# Patient Record
Sex: Male | Born: 1950 | Race: White | Hispanic: No | Marital: Married | Smoking: Former smoker
Health system: Southern US, Community
[De-identification: ages and names within clinical notes are randomized; demographics above are authoritative.]

## PROBLEM LIST (undated history)

## (undated) DIAGNOSIS — F988 Other specified behavioral and emotional disorders with onset usually occurring in childhood and adolescence: Secondary | ICD-10-CM

## (undated) DIAGNOSIS — M7989 Other specified soft tissue disorders: Secondary | ICD-10-CM

## (undated) DIAGNOSIS — E7800 Pure hypercholesterolemia, unspecified: Secondary | ICD-10-CM

## (undated) DIAGNOSIS — M2550 Pain in unspecified joint: Secondary | ICD-10-CM

## (undated) DIAGNOSIS — E119 Type 2 diabetes mellitus without complications: Secondary | ICD-10-CM

## (undated) DIAGNOSIS — R0602 Shortness of breath: Secondary | ICD-10-CM

## (undated) DIAGNOSIS — M25562 Pain in left knee: Secondary | ICD-10-CM

## (undated) DIAGNOSIS — G4730 Sleep apnea, unspecified: Secondary | ICD-10-CM

## (undated) DIAGNOSIS — K769 Liver disease, unspecified: Secondary | ICD-10-CM

## (undated) DIAGNOSIS — K754 Autoimmune hepatitis: Secondary | ICD-10-CM

## (undated) DIAGNOSIS — K760 Fatty (change of) liver, not elsewhere classified: Secondary | ICD-10-CM

## (undated) DIAGNOSIS — C801 Malignant (primary) neoplasm, unspecified: Secondary | ICD-10-CM

## (undated) DIAGNOSIS — I10 Essential (primary) hypertension: Secondary | ICD-10-CM

## (undated) DIAGNOSIS — M1990 Unspecified osteoarthritis, unspecified site: Secondary | ICD-10-CM

## (undated) DIAGNOSIS — E663 Overweight: Secondary | ICD-10-CM

## (undated) HISTORY — DX: Sleep apnea, unspecified: G47.30

## (undated) HISTORY — DX: Overweight: E66.3

## (undated) HISTORY — DX: Pure hypercholesterolemia, unspecified: E78.00

## (undated) HISTORY — DX: Liver disease, unspecified: K76.9

## (undated) HISTORY — DX: Pain in unspecified joint: M25.50

## (undated) HISTORY — PX: NECK SURGERY: SHX720

## (undated) HISTORY — DX: Type 2 diabetes mellitus without complications: E11.9

## (undated) HISTORY — PX: FACIAL FRACTURE SURGERY: SHX1570

## (undated) HISTORY — DX: Shortness of breath: R06.02

## (undated) HISTORY — DX: Other specified soft tissue disorders: M79.89

## (undated) HISTORY — DX: Fatty (change of) liver, not elsewhere classified: K76.0

## (undated) HISTORY — DX: Other specified behavioral and emotional disorders with onset usually occurring in childhood and adolescence: F98.8

## (undated) HISTORY — DX: Pain in left knee: M25.562

---

## 1971-08-19 HISTORY — PX: FACIAL FRACTURE SURGERY: SHX1570

## 1994-08-18 HISTORY — PX: LIVER BIOPSY: SHX301

## 2001-02-22 ENCOUNTER — Ambulatory Visit (HOSPITAL_COMMUNITY): Admission: RE | Admit: 2001-02-22 | Discharge: 2001-02-22 | Payer: Self-pay | Admitting: Gastroenterology

## 2001-02-22 ENCOUNTER — Encounter (INDEPENDENT_AMBULATORY_CARE_PROVIDER_SITE_OTHER): Payer: Self-pay | Admitting: Specialist

## 2003-08-19 HISTORY — PX: KNEE ARTHROSCOPY: SHX127

## 2003-12-19 ENCOUNTER — Ambulatory Visit (HOSPITAL_BASED_OUTPATIENT_CLINIC_OR_DEPARTMENT_OTHER): Admission: RE | Admit: 2003-12-19 | Discharge: 2003-12-19 | Payer: Self-pay | Admitting: Internal Medicine

## 2007-04-21 ENCOUNTER — Ambulatory Visit (HOSPITAL_COMMUNITY): Admission: RE | Admit: 2007-04-21 | Discharge: 2007-04-22 | Payer: Self-pay | Admitting: Neurosurgery

## 2007-10-24 ENCOUNTER — Observation Stay (HOSPITAL_COMMUNITY): Admission: EM | Admit: 2007-10-24 | Discharge: 2007-10-26 | Payer: Self-pay | Admitting: Emergency Medicine

## 2007-11-02 ENCOUNTER — Ambulatory Visit (HOSPITAL_COMMUNITY): Admission: RE | Admit: 2007-11-02 | Discharge: 2007-11-02 | Payer: Self-pay | Admitting: Gastroenterology

## 2008-01-21 ENCOUNTER — Encounter: Payer: Self-pay | Admitting: Internal Medicine

## 2009-05-05 ENCOUNTER — Inpatient Hospital Stay (HOSPITAL_COMMUNITY): Admission: EM | Admit: 2009-05-05 | Discharge: 2009-05-07 | Payer: Self-pay | Admitting: Emergency Medicine

## 2010-06-28 ENCOUNTER — Ambulatory Visit (HOSPITAL_COMMUNITY): Admission: RE | Admit: 2010-06-28 | Discharge: 2010-06-28 | Payer: Self-pay | Admitting: Gastroenterology

## 2010-09-19 NOTE — Letter (Signed)
Summary: [facility]  [facility]   Imported By: Stephannie Li 02/02/2008 11:37:13  _____________________________________________________________________  External Attachment:    Type:   Image     Comment:   External Document

## 2010-10-29 LAB — CBC
HCT: 44.4 % (ref 39.0–52.0)
Hemoglobin: 15.5 g/dL (ref 13.0–17.0)
MCH: 32 pg (ref 26.0–34.0)
MCHC: 35 g/dL (ref 30.0–36.0)
MCV: 91.4 fL (ref 78.0–100.0)
Platelets: 306 10*3/uL (ref 150–400)
RBC: 4.86 MIL/uL (ref 4.22–5.81)
RDW: 16.4 % — ABNORMAL HIGH (ref 11.5–15.5)
WBC: 9.2 10*3/uL (ref 4.0–10.5)

## 2010-10-29 LAB — APTT: aPTT: 28 seconds (ref 24–37)

## 2010-10-29 LAB — PROTIME-INR
INR: 0.98 (ref 0.00–1.49)
Prothrombin Time: 13.2 seconds (ref 11.6–15.2)

## 2010-11-22 LAB — COMPREHENSIVE METABOLIC PANEL
ALT: 46 U/L (ref 0–53)
ALT: 48 U/L (ref 0–53)
ALT: 51 U/L (ref 0–53)
AST: 29 U/L (ref 0–37)
AST: 31 U/L (ref 0–37)
AST: 34 U/L (ref 0–37)
Albumin: 3.2 g/dL — ABNORMAL LOW (ref 3.5–5.2)
Albumin: 3.4 g/dL — ABNORMAL LOW (ref 3.5–5.2)
Albumin: 4.1 g/dL (ref 3.5–5.2)
Alkaline Phosphatase: 47 U/L (ref 39–117)
Alkaline Phosphatase: 49 U/L (ref 39–117)
Alkaline Phosphatase: 58 U/L (ref 39–117)
BUN: 15 mg/dL (ref 6–23)
BUN: 17 mg/dL (ref 6–23)
BUN: 21 mg/dL (ref 6–23)
CO2: 27 mEq/L (ref 19–32)
CO2: 28 mEq/L (ref 19–32)
CO2: 30 mEq/L (ref 19–32)
Calcium: 8.8 mg/dL (ref 8.4–10.5)
Calcium: 8.8 mg/dL (ref 8.4–10.5)
Calcium: 9.5 mg/dL (ref 8.4–10.5)
Chloride: 94 mEq/L — ABNORMAL LOW (ref 96–112)
Chloride: 97 mEq/L (ref 96–112)
Chloride: 98 mEq/L (ref 96–112)
Creatinine, Ser: 1.12 mg/dL (ref 0.4–1.5)
Creatinine, Ser: 1.21 mg/dL (ref 0.4–1.5)
Creatinine, Ser: 1.37 mg/dL (ref 0.4–1.5)
GFR calc Af Amer: >60 mL/min (ref >60)
GFR calc Af Amer: >60 mL/min (ref >60)
GFR calc Af Amer: >60 mL/min (ref >60)
GFR calc non Af Amer: 53 mL/min — ABNORMAL LOW (ref >60)
GFR calc non Af Amer: >60 mL/min (ref >60)
GFR calc non Af Amer: >60 mL/min (ref >60)
Glucose, Bld: 119 mg/dL — ABNORMAL HIGH (ref 70–99)
Glucose, Bld: 126 mg/dL — ABNORMAL HIGH (ref 70–99)
Glucose, Bld: 155 mg/dL — ABNORMAL HIGH (ref 70–99)
Potassium: 3.5 mEq/L (ref 3.5–5.1)
Potassium: 3.6 mEq/L (ref 3.5–5.1)
Potassium: 3.7 mEq/L (ref 3.5–5.1)
Sodium: 131 mEq/L — ABNORMAL LOW (ref 135–145)
Sodium: 134 mEq/L — ABNORMAL LOW (ref 135–145)
Sodium: 136 mEq/L (ref 135–145)
Total Bilirubin: 1.3 mg/dL — ABNORMAL HIGH (ref 0.3–1.2)
Total Bilirubin: 1.3 mg/dL — ABNORMAL HIGH (ref 0.3–1.2)
Total Bilirubin: 1.4 mg/dL — ABNORMAL HIGH (ref 0.3–1.2)
Total Protein: 6.8 g/dL (ref 6.0–8.3)
Total Protein: 7.1 g/dL (ref 6.0–8.3)
Total Protein: 8.3 g/dL (ref 6.0–8.3)

## 2010-11-22 LAB — CBC
HCT: 43.9 % (ref 39.0–52.0)
HCT: 46.4 % (ref 39.0–52.0)
HCT: 51.6 % (ref 39.0–52.0)
Hemoglobin: 15.4 g/dL (ref 13.0–17.0)
Hemoglobin: 16.3 g/dL (ref 13.0–17.0)
Hemoglobin: 17.6 g/dL — ABNORMAL HIGH (ref 13.0–17.0)
MCHC: 34.2 g/dL (ref 30.0–36.0)
MCHC: 35.2 g/dL (ref 30.0–36.0)
MCHC: 35.2 g/dL (ref 30.0–36.0)
MCV: 92.7 fL (ref 78.0–100.0)
MCV: 92.9 fL (ref 78.0–100.0)
MCV: 93.4 fL (ref 78.0–100.0)
Platelets: 285 10*3/uL (ref 150–400)
Platelets: 286 10*3/uL (ref 150–400)
Platelets: 345 10*3/uL (ref 150–400)
RBC: 4.74 MIL/uL (ref 4.22–5.81)
RBC: 4.99 MIL/uL (ref 4.22–5.81)
RBC: 5.53 MIL/uL (ref 4.22–5.81)
RDW: 15.8 % — ABNORMAL HIGH (ref 11.5–15.5)
RDW: 16.2 % — ABNORMAL HIGH (ref 11.5–15.5)
RDW: 16.3 % — ABNORMAL HIGH (ref 11.5–15.5)
WBC: 10.7 10*3/uL — ABNORMAL HIGH (ref 4.0–10.5)
WBC: 5.7 10*3/uL (ref 4.0–10.5)
WBC: 5.7 10*3/uL (ref 4.0–10.5)

## 2010-11-22 LAB — URINALYSIS, ROUTINE W REFLEX MICROSCOPIC
Bilirubin Urine: NEGATIVE
Glucose, UA: NEGATIVE mg/dL
Ketones, ur: NEGATIVE mg/dL
Leukocytes, UA: NEGATIVE
Nitrite: NEGATIVE
Protein, ur: NEGATIVE mg/dL
Specific Gravity, Urine: >1.046 — ABNORMAL HIGH (ref 1.005–1.030)
Urobilinogen, UA: 0.2 mg/dL (ref 0.0–1.0)
pH: 6 (ref 5.0–8.0)

## 2010-11-22 LAB — CULTURE, BLOOD (ROUTINE X 2)
Culture: NO GROWTH
Culture: NO GROWTH

## 2010-11-22 LAB — URINE MICROSCOPIC-ADD ON

## 2010-11-22 LAB — DIFFERENTIAL
Basophils Absolute: 0 10*3/uL (ref 0.0–0.1)
Basophils Absolute: 0 10*3/uL (ref 0.0–0.1)
Basophils Absolute: 0 10*3/uL (ref 0.0–0.1)
Basophils Relative: 0 % (ref 0–1)
Basophils Relative: 0 % (ref 0–1)
Basophils Relative: 0 % (ref 0–1)
Eosinophils Absolute: 0 10*3/uL (ref 0.0–0.7)
Eosinophils Absolute: 0.1 10*3/uL (ref 0.0–0.7)
Eosinophils Absolute: 0.1 10*3/uL (ref 0.0–0.7)
Eosinophils Relative: 0 % (ref 0–5)
Eosinophils Relative: 2 % (ref 0–5)
Eosinophils Relative: 2 % (ref 0–5)
Lymphocytes Relative: 10 % — ABNORMAL LOW (ref 12–46)
Lymphocytes Relative: 12 % (ref 12–46)
Lymphocytes Relative: 3 % — ABNORMAL LOW (ref 12–46)
Lymphs Abs: 0.4 10*3/uL — ABNORMAL LOW (ref 0.7–4.0)
Lymphs Abs: 0.6 10*3/uL — ABNORMAL LOW (ref 0.7–4.0)
Lymphs Abs: 0.7 10*3/uL (ref 0.7–4.0)
Monocytes Absolute: 0.4 10*3/uL (ref 0.1–1.0)
Monocytes Absolute: 0.6 10*3/uL (ref 0.1–1.0)
Monocytes Absolute: 0.7 10*3/uL (ref 0.1–1.0)
Monocytes Relative: 11 % (ref 3–12)
Monocytes Relative: 12 % (ref 3–12)
Monocytes Relative: 4 % (ref 3–12)
Neutro Abs: 4.2 10*3/uL (ref 1.7–7.7)
Neutro Abs: 4.4 10*3/uL (ref 1.7–7.7)
Neutro Abs: 9.8 10*3/uL — ABNORMAL HIGH (ref 1.7–7.7)
Neutrophils Relative %: 75 % (ref 43–77)
Neutrophils Relative %: 77 % (ref 43–77)
Neutrophils Relative %: 92 % — ABNORMAL HIGH (ref 43–77)

## 2010-11-22 LAB — POCT I-STAT, CHEM 8
BUN: 19 mg/dL (ref 6–23)
Calcium, Ion: 1.13 mmol/L (ref 1.12–1.32)
Chloride: 99 mEq/L (ref 96–112)
Creatinine, Ser: 1 mg/dL (ref 0.4–1.5)
Glucose, Bld: 157 mg/dL — ABNORMAL HIGH (ref 70–99)
HCT: 55 % — ABNORMAL HIGH (ref 39.0–52.0)
Hemoglobin: 18.7 g/dL — ABNORMAL HIGH (ref 13.0–17.0)
Potassium: 3.7 mEq/L (ref 3.5–5.1)
Sodium: 136 mEq/L (ref 135–145)
TCO2: 29 mmol/L (ref 0–100)

## 2010-11-22 LAB — MAGNESIUM
Magnesium: 1.9 mg/dL (ref 1.5–2.5)
Magnesium: 2 mg/dL (ref 1.5–2.5)

## 2010-11-22 LAB — LIPASE, BLOOD: Lipase: 20 U/L (ref 11–59)

## 2010-12-31 NOTE — H&P (Signed)
NAME:  Bryan Wang, Bryan Wang             ACCOUNT NO.:  [id number]   MEDICAL RECORD NO.:  [id number]          PATIENT TYPE:  EMS   LOCATION:  ED                           FACILITY:  [facility]   PHYSICIAN:  [name], MD      DATE OF BIRTH:  Jan 17, 1951   DATE OF ADMISSION:  10/24/2007  DATE OF DISCHARGE:                              HISTORY & PHYSICAL   GASTROENTEROLOGIST:  Griffith Citron, M.D.   CHIEF COMPLAINT:  Coffee ground emesis, abdominal distention, and  diarrhea.   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  According to the patient he is feeling sick for a couple of days.  Early  morning, he started to have a huge amount of coffee ground emesis and  also abdominal distention and epigastric abdominal pain.  The patient  denied any history of endoscopy in the past and denies any similar  history in the past.  The patient denies any convulsions, denies any  change in mental status and denies any constipation.  Last bowel  movement was today watery, about two black stools.  The patient also  admitted he has watery stool yesterday about three times and is watery.  Condition associated with nausea.  The patient denies any fevers, denies  any shortness of breath or chest pain, denies any palpitations.   PAST MEDICAL HISTORY:  1. History of autoimmune hepatitis versus idiopathic hepatitis.  2. History of disk prolapse of the cervical region.   PAST SURGICAL HISTORY:  Status post liver biopsy.  The patient followed  by Dr. Kinnie Scales.   ALLERGIES:  No known drug allergies.   SOCIAL HISTORY:  The patient works as a Sports coach, here with his  wife he had daughter.  The patient denies any smoking or alcohol  history.  Denies any IV drug abuse.   FAMILY HISTORY:  Father alive had history of diabetes.  Mother is  healthy.   REVIEW OF SYSTEMS:  Complaint of dizziness.  Denies any headache.  Denies any numbness or weakness or extremity.  Denies any seizure  activity.  Denies any ecchymosis.  Denies any altered mental status.  Denies any chest pain.  Denies any burning micturition of hematuria.  Complained of dark stools.   PHYSICAL EXAMINATION:  VITAL SIGNS:  Blood pressure 116/70, pulse rate  105, respiratory rate 18, saturations 96%, and temperature 98.6.  GENERAL:  Obese male not in respiratory distress or shortness of breath.  HEENT:  Normocephalic and atraumatic.  Pupils equal, round, and reactive  to light and accommodation.  No jaundice, no cyanosis.  Mucosa moist.  No evidence of ecchymosis.  NECK:  Short.  No JVD and no lymphadenopathy.  HEART:  S1 and S2 tachy, no other murmur.  LUNGS:  Normal regular breathing, equal air entry.  ABDOMEN:  Distended.  Mild epigastric tenderness.  No rebound or  guarding.  No  organomegaly can be palpated.  Bowel sounds are audible,  but kind of diminished.  EXTREMITIES:  No lower extremity edema.  RECTAL:  Not being done.  SKIN:  No ecchymosis and no evidence of cyanosis.   LABORATORY DATA:  PT/INR 1.2.  Urinalysis; white blood cells 3.6, and  moderate bilirubin, negative for blood, lipase 23.  CMP showed sodium  137, potassium 3.8, chloride 99, CO2 27, glucose 137, BUN 121,  creatinine 1.4.  CBC; white blood cell count 5.9, hemoglobin 17.1,  hematocrit 47.9, platelets 357.   ASSESSMENT:  This is a 60 year old male with a history of autoimmune  hepatitis/idiopathic hepatitis, admitted to the hospital with coffee  ground emesis and abdominal distention most probably secondary to upper  gastrointestinal bleeding, cannot rule out gastritis versus gastric  ulcers versus esophageal varices .  The patient was admitted to  telemetry floor.  We will keep NPO.  We will place the patient on   Protonix IV 40 mg q.12 hours.  There is no evidence of chronic liver  disease in the sense of PT/INR or platelets.  The patient will be placed  octreotide IV 25 mcg per hour.  Gastroenterology consulted, Dr. Kinnie Scales.  He will see the patient today.  We will check CBC q.4 hours.  Abdominal  x-ray showed possibility of partial small bowel obstruction and with  evidence of abdominal distention on examination, we will go ahead and do  CT abdomen and pelvis with p.o. contrast for further evaluation.  We  will consider surgical consult if a necessity.  At this time we will  keep the patient NPO.  DVT and GI prophylaxis.      [name], MD  Electronically Signed     HIE/MEDQ  D:  10/24/2007  T:  10/24/2007  Job:  [id number]   cc:   Griffith Citron, M.D.  Fax: [phone]

## 2010-12-31 NOTE — Consult Note (Signed)
NAMEKIMANI, HOVIS             ACCOUNT NO.:  [id number]   MEDICAL RECORD NO.:  [id number]          PATIENT TYPE:  INP   LOCATION:  1403                         FACILITY:  [facility]   PHYSICIAN:  Griffith Citron, M.D.DATE OF BIRTH:  08-27-1950   DATE OF CONSULTATION:  10/24/2007  DATE OF DISCHARGE:                                 CONSULTATION   PATIENT PROFILE:  A 60 year old white male.   CHIEF COMPLAINT:  Abdominal pain.   HISTORY OF PRESENT ILLNESS:  The patient is a 60 year old white male who  is well known to me from several years of care for his autoimmune  hepatitis.  His present illness began five days ago when he had two  small volume semiformed stools.  [place] the [place]  business trip.  The following day, four days ago, he began to develop  mild epigastric soreness which gradually worsened on Friday and became  more severe yesterday.  This morning, he awakened with abdominal  distention feeling feverish with nausea.  He had three bouts of large  volume hematemesis each with coffee ground material.  No bright red  blood.  He did have some relief following emesis.  Seen at Urgent Care  and referred on to [facility] emergency room.  A low  grade fever of 99 was documented.  He was admitted with possible upper  GI bleed.  Abdominal series showed partial small bowel obstruction.  Labs were unrevealing except for mild transaminitis, mild pyuria with  normal hemoglobin/hematocrit.  Pro-time/INR is normal.   The patient was admitted and is presently comfortable after receiving  Phenergan, octreotide.  He is awaiting an abdominal CT with IV and oral  contrast.   The patient has not undergone any prior abdominal surgery.  He has  frequent indigestion for which he uses TUMS on a fairly regular basis.  He uses NSAIDS rarely.  No tobacco and no alcohol use.   The patient's autoimmune hepatitis has been treated for many years with  low dose  prednisone, presently on prednisone 7 mg daily and azathioprine  150 mg per day.  He has not been followed since C-spine laminectomy  September of 2008.  He is asymptomatic from his liver disease.   PAST MEDICAL HISTORY:  1. C-spine surgery in September 2008.  2. Obesity.   SOCIAL HISTORY:  The patient is married to wife, Elita Quick.  He is a Medical illustrator.  He has one daughter.  Her marriage is anticipated February 19, 2008.   PHYSICAL EXAMINATION:  GENERAL:  Comfortable, lying in bed watching  television, alert and oriented x3.  VITAL SIGNS:  Stable.  He is presently afebrile.  HEENT:  Anicteric sclerae, pink conjunctivae, no oropharyngeal lesion.  Mild tongue coating.  No exudate in the oropharynx.  Lips, gums, and  teeth are normal.  NECK:  Supple without adenopathy, thyromegaly or bruit.  CHEST:  Clear to auscultation.  There are no adventitious sounds.  HEART:  Regular rhythm.  No gallop and no murmur.  ABDOMEN:  Tightly distended.  There is no palpable organomegaly.  No  mass or firmness.  Bowel sounds are present, but scarce.  There is no  succussion splash.  The epigastrium is tender as is the right lower  quadrant.  There is no visible rebound, though, the patient does  complain to be tender on withdrawal.  RECTAL:  Not performed.  EXTREMITIES:  Well-perfused with normal capillary fill time without  edema or cyanosis.   LABORATORY DATA:  The patient has a mild ALT greater than AST elevation.  He has increased gamma globulins consistent with AIH.  The urine is  notable for ketones with 3-6 WBC per high powered field.   ASSESSMENT:  1. Partial small bowel obstruction.  The patient's symptoms, physical      examination, and abdominal x-rays are consistent with a partial      small bowel obstruction.  The cause is not obvious.  We must be      careful not to miss peritoneal inflammation with the patient on      prednisone, albeit low dose.  Cannot rule out appendicitis, ulcer       penetration.  I definitely agree with abdominal CT to further      delineate underlying pathology.  Also need to consider more      esoteric etiologies, EKG, Meckel's diverticulum, internal      herniation.  2. Transaminitis, secondary to autoimmune hepatitis.  The patient      needs increased steroid coverage.  This will be accomplished with      IV Solu-Medrol during his acute hospitalization.  There is no      evidence of liver disease contributing to his current illness.  3. Dyspepsia, hematemesis with coffee grounds certainly raises the      possibility of an acute gastritis, peptic disease possibly      exacerbated by recent NSAIDS use.  He is at increased risk on      prednisone.  IV Protonix has been initiated empirically and will be      continued.  Endoscopy will be considered, but the counter veiling      consideration is that of possible obstruction which is relative      contraindication to endoscopic evaluation.  This will be revisited      after CT information is reviewed.  4. Autoimmune hepatitis, longstanding, generally well controlled.      There is no indication of cirrhosis or hepatic decompensation.  The      patient is on azathioprine which can precipitate pancreatitis.      There is no evidence of pancreatitis with normal lipase, no      complaints of back pain.  CT scan will help to further evaluate any      pancreatic inflammation.   RECOMMENDATIONS:  1. Remain NPO, support with IV fluids.  2. Agree with IV Protonix.  3. Agree with abdominal CT.  4. Endoscopy will be considered pending CT findings.  If obstruction      is confirmed then endoscopy is contraindicated.  5. Surgical consultation will be obtained if there is a high grade      obstruction.  6. IV Solu-Medrol to cover steroid requirements while NPO in the      hospital.  7. Consider NG decompression if nausea and vomiting recur.  In the      interim, treat symptomatically with antiemetics.  Follow  up      evaluation will take place in the early morning following abdominal      CT.  Griffith Citron, M.D.  Electronically Signed     JRM/MEDQ  D:  10/24/2007  T:  10/25/2007  Job:  [id number]   cc:   [name], MD

## 2010-12-31 NOTE — Op Note (Signed)
NAME:  Bryan Wang, Bryan Wang NO.:  [id number]   MEDICAL RECORD NO.:  [id number]          PATIENT TYPE:  OIB   LOCATION:  5151                         FACILITY:  [facility]   PHYSICIAN:  Cristi Loron, M.D.DATE OF BIRTH:  1951-07-26   DATE OF PROCEDURE:  04/21/2007  DATE OF DISCHARGE:                               OPERATIVE REPORT   BRIEF HISTORY:  The patient is a 60 year old white male who has suffered  from neck and arm pain.  He has failed medical management, was worked up  with a cervical MRI which demonstrated herniated disk at C5-6.  The  patient's symptoms were consistent with a C6 radiculopathy.  I discussed  the various treatment options with the patient including surgery.  The  patient has weighed the risks, benefits and alternatives to surgery and  decided to proceed with C5-6 anterior cervical diskectomy fusion  plating.   PREOPERATIVE DIAGNOSIS:  C5-6 herniated nucleus pulposus, spondylosis,  disk degeneration, cervical radiculopathy, cervicalgia.   POSTOPERATIVE DIAGNOSIS:  C5-6 herniated nucleus pulposus, spondylosis,  disk degeneration, cervical radiculopathy, cervicalgia.   PROCEDURE:  C5-6 extensive anterior cervical diskectomy/decompression;  C5-6 anterior interbody local morselized autograft arthrodesis;  insertion of C5-6 interbody prosthesis (Alphatec PEEK interbody  prosthesis); C5-6 anterior cervical plating with Codman slim lock  titanium plate and screws.   SURGEON:  Dr. Delma Officer.   ASSISTANT:  Dr. Barnett Abu.   ANESTHESIA:  General endotracheal.   ESTIMATED BLOOD LOSS:  50 mL.   SPECIMENS:  None.   DRAINS:  None.   COMPLICATIONS:  None.   DESCRIPTION OF PROCEDURE:  The patient was brought to the operating room  by anesthesia team.  General endotracheal anesthesia was induced.  The  patient remained supine position.  A roll was placed under his shoulder,  placing his neck in slight extension.  His anterior cervical region  was  then prepared with Betadine scrub and Betadine solution.  Sterile drapes  were applied.  I then injected area to be incised with Marcaine with  epinephrine solution and used a scalpel to make a transverse incision in  the patient's left anterior neck.  I used the Metzenbaum scissors to  divide the platysma muscle and to dissect medial to the  sternocleidomastoid muscle, jugular vein and carotid artery.  I  carefully dissected down towards the anterior cervical spine,  identifying the esophagus and retracting it medially.  We then used  Kitner swabs to clear the soft tissue from the anterior cervical spine  and then inserted a bent spinal needle into the upper exposed  intervertebral disk space.  We obtained intraoperative radiograph to  confirm our location.   We then used electrocautery to detach the medial border of the longus  colli muscle bilaterally from C5-6 intervertebral disk space.  Inserted  a Engineer, technical sales for exposure.  We then incised the C5-  6 intervertebral disk with a 15 blade scalpel.  It was quite  spondylotic.  We performed a partial intervertebral diskectomy with a  pituitary forceps.  We inserted distraction screws to C5 and C6,  distracted interspace and then we  used a high-speed drill to decorticate  the vertebral endplates at C5-6, drill away the remainder of C5-6  intervertebral disk to drill away some posterior spondylosis and then to  thin out the posterior longitudinal ligament.  We incised the ligament  with arachnoid knife and removed it with a Kerrison punch undercutting  the vertebral endplates at C5-6 decompressing the thecal sac.  We then  performed foraminotomies about the bilateral C6 nerve root completing  the decompression.  Of note, we did encounter the expected herniated  disk which was more or less bilateral compressing bilateral C6 nerve  root.   Having completed the decompression we now turned attention to   arthrodesis.  I should mention that we inserted distraction screws to C5  and 6 distracted interspace prior to the decompression.  We used a trial  spacers and determined to use a 5-mm medium Alphatec PEEK interbody  prosthesis.  We prefilled this prosthesis with a combination of local  autograft bone and Vitoss bone graft extender and inserted the  prosthesis into distracted interspace, completing the arthrodesis.  We  removed distraction screws and noted there was a snug fit of the  prosthesis in the interspace.   We now turned attention to the anterior spinal instrumentation.  We used  a high-speed drill remove some ventral spondylosis from the vertebral  endplates at C5-6 so the plate would lay down flat.  We then selected  appropriate length Codman slim lock anterior cervical plate and laid it  along the anterior aspect of the vertebral bodies at C5 and C6.  We then  used the drill to drill two 14 mm holes at C5 and C6.  We then secured  the plate to the vertebral bodies by placing two 14 mm self-tapping  screws at C5 two at C6.  We obtained intraoperative radiograph.  There  was limited visualization because of the patient's body habitus but the  prosthesis and plate looked in good position in vivo.  We therefore  secured the screws to the plate by locking each cam completing the  instrumentation.   We then obtained hemostasis using bipolar cautery.  We irrigated the  wound out with bacitracin solution, removed the Caspar retractor.  We  then inspected esophagus for damage and none was noted.  We then  reapproximated the patient's platysma muscle with interrupted 3-0 Vicryl  suture, subcutaneous tissue with interrupted 3-0 Vicryl suture and skin  with Steri-Strips and Benzoin.  The wound was then coated with  bacitracin solution.  Sterile dressing applied.  The drapes were  removed.  The patient was subsequently extubated by anesthesia team and  transported to the post anesthesia  care unit in stable condition.  All  sponge, instrument and needle counts were correct at the end the case.      Cristi Loron, M.D.  Electronically Signed     JDJ/MEDQ  D:  04/21/2007  T:  04/22/2007  Job:  [id number]

## 2010-12-31 NOTE — Discharge Summary (Signed)
[name], Bryan Wang             ACCOUNT NO.:  [id number]   MEDICAL RECORD NO.:  [id number]          PATIENT TYPE:  INP   LOCATION:  1403                         FACILITY:  [facility]   PHYSICIAN:  Hind I Elsaid, MD      DATE OF BIRTH:  31-Jul-1951   DATE OF ADMISSION:  10/24/2007  DATE OF DISCHARGE:  10/26/2007                               DISCHARGE SUMMARY   DISCHARGE DIAGNOSES:  1. Upper gastrointestinal bleeding.  2. Gastric ulcer.  3. Function gastric outlet obstruction.  4. Abdominal pain felt to be secondary to renal colic, resolved.  5. Autoimmune hepatitis.  6. Partial small bowel obstruction, resolved.  7. Cholelithiasis.  8. Right kidney stone.   MEDICATIONS:  1. Protonix 40 mg p.o. b.i.d.  2. Imuran 150 mg p.o. daily.  3. Prednisone 7.5 mg p.o. daily.   PROCEDURES:  1. EGD and colonoscopy show gastric ulcer, acute without hemorrhage.  2. Abdominal x-ray finding consistent with partial small bowel      obstruction.  3. CT abdomen and pelvis/resolved small bowel obstruction with free      fluid in the distal small bowel. Mesentery without other focal      inflammatory change, normal.  Oblique: Cholelithiasis in the      gallbladder neck, right nephrolithiasis. Left renal cyst.  4. Abdominal x-ray: Small bowel obstruction, resolved      radiographically.  5. Contrast films are normal: Colon and appendix.   HOSPITAL COURSE:  History reviewed and history done by Dr. Eda Paschal Hind  and summarized. This is a 60 year old male with history of autoimmune  hepatitis on chronic steroids admitted to the hospital because of  complaint of abdominal pain with nausea, vomiting. The patient  experienced coffee ground emesis. Abdominal x-ray showed  ulcerative  small bowel obstruction.  The patient was admitted for monitoring and  evaluation.   1. Coffee ground emesis:  The patient was started on IV fluids and CBC      q.6.h.  During hospitalization, the patient did not require  any      kind of blood transfusion. Hemoglobin remained stable around 13.4.      It was noticed patient did not have any signal of chronic liver      disease in essence of low platelets or high PT/INR.  The patient      was restarted on Protonix IV and Gastroenterology consulted done by      Dr.  Kinnie Scales.  Endoscopy was done on March 9th, result as above.      The patient was started on a clear liquid diet and advanced diet      was done.  The patient denies any abdominal pain, nausea, or      vomiting.   1. Ulcerative small bowel obstruction:  CT scan was done which did      show resolved small bowel obstruction.  The patient has good bowel      movement and passing gas.  No evidence of abdominal pain.   1. Abdominal pain:  Abdominal pain  thought to be secondary to  nephrolithiasis and renal colic.  Pain completely resolved on the      date of discharge.   1. Autoimmune hepatitis: Patient placed on IV Solu-Medrol and Imuran      was resumed.  During hospitalization, pain completely resolved.  No      evidence of nausea or vomiting.  We felt that the patient was      medically stable to be discharged on Protonix and follow with Dr.      Kinnie Scales as an outpatient.  If the patient experiences any kind of      pain, the patient was advised to follow with a urologist regarding      his kidney stone.      [name], MD  Electronically Signed     HIE/MEDQ  D:  10/26/2007  T:  10/26/2007  Job:  [id number]   cc:   Griffith Citron, M.D.  Fax: [phone]

## 2011-05-12 LAB — CBC
HCT: 38.1 — ABNORMAL LOW
HCT: 38.2 — ABNORMAL LOW
HCT: 38.4 — ABNORMAL LOW
HCT: 39.6
HCT: 40.4
HCT: 41.3
HCT: 41.9
HCT: 42.4
HCT: 45.2
HCT: 47.9
Hemoglobin: 13.7
Hemoglobin: 13.7
Hemoglobin: 13.8
Hemoglobin: 14.1
Hemoglobin: 14.3
Hemoglobin: 14.4
Hemoglobin: 14.5
Hemoglobin: 14.7
Hemoglobin: 15.8
Hemoglobin: 17.1 — ABNORMAL HIGH
MCHC: 34.2
MCHC: 34.7
MCHC: 35
MCHC: 35.1
MCHC: 35.6
MCHC: 35.6
MCHC: 35.7
MCHC: 35.8
MCHC: 35.9
MCHC: 36
MCV: 90.1
MCV: 90.1
MCV: 90.1
MCV: 90.6
MCV: 91
MCV: 91
MCV: 91.2
MCV: 91.6
MCV: 92.3
MCV: 92.8
Platelets: 274
Platelets: 290
Platelets: 299
Platelets: 305
Platelets: 314
Platelets: 314
Platelets: 320
Platelets: 328
Platelets: 338
Platelets: 357
RBC: 4.2 — ABNORMAL LOW
RBC: 4.23
RBC: 4.24
RBC: 4.35
RBC: 4.48
RBC: 4.51
RBC: 4.57
RBC: 4.6
RBC: 4.9
RBC: 5.31
RDW: 14.7
RDW: 14.9
RDW: 15.4
RDW: 15.5
RDW: 15.6 — ABNORMAL HIGH
RDW: 15.6 — ABNORMAL HIGH
RDW: 15.7 — ABNORMAL HIGH
RDW: 15.8 — ABNORMAL HIGH
RDW: 15.9 — ABNORMAL HIGH
RDW: 16 — ABNORMAL HIGH
WBC: 3.6 — ABNORMAL LOW
WBC: 3.7 — ABNORMAL LOW
WBC: 4.1
WBC: 4.5
WBC: 5.2
WBC: 5.8
WBC: 5.9
WBC: 6
WBC: 6.3
WBC: 6.3

## 2011-05-12 LAB — COMPREHENSIVE METABOLIC PANEL
ALT: 62 — ABNORMAL HIGH
ALT: 72 — ABNORMAL HIGH
ALT: 79 — ABNORMAL HIGH
AST: 38 — ABNORMAL HIGH
AST: 44 — ABNORMAL HIGH
AST: 48 — ABNORMAL HIGH
Albumin: 3 — ABNORMAL LOW
Albumin: 3.1 — ABNORMAL LOW
Albumin: 4
Alkaline Phosphatase: 38 — ABNORMAL LOW
Alkaline Phosphatase: 41
Alkaline Phosphatase: 50
BUN: 19
BUN: 20
BUN: 22
CO2: 25
CO2: 27
CO2: 27
Calcium: 10.1
Calcium: 8.5
Calcium: 8.5
Chloride: 103
Chloride: 105
Chloride: 99
Creatinine, Ser: 1.09
Creatinine, Ser: 1.26
Creatinine, Ser: 1.4
GFR calc Af Amer: >60
GFR calc Af Amer: >60
GFR calc Af Amer: >60
GFR calc non Af Amer: 52 — ABNORMAL LOW
GFR calc non Af Amer: 59 — ABNORMAL LOW
GFR calc non Af Amer: >60
Glucose, Bld: 117 — ABNORMAL HIGH
Glucose, Bld: 135 — ABNORMAL HIGH
Glucose, Bld: 137 — ABNORMAL HIGH
Potassium: 3.8
Potassium: 4
Potassium: 4.2
Sodium: 136
Sodium: 137
Sodium: 138
Total Bilirubin: 1.1
Total Bilirubin: 1.8 — ABNORMAL HIGH
Total Bilirubin: 2.1 — ABNORMAL HIGH
Total Protein: 6.5
Total Protein: 7
Total Protein: 8.7 — ABNORMAL HIGH

## 2011-05-12 LAB — DIFFERENTIAL
Basophils Absolute: 0
Basophils Relative: 1
Eosinophils Absolute: 0
Eosinophils Relative: 0
Lymphocytes Relative: 9 — ABNORMAL LOW
Lymphs Abs: 0.5 — ABNORMAL LOW
Monocytes Absolute: 0.8
Monocytes Relative: 14 — ABNORMAL HIGH
Neutro Abs: 4.5
Neutrophils Relative %: 77

## 2011-05-12 LAB — URINALYSIS, ROUTINE W REFLEX MICROSCOPIC
Glucose, UA: NEGATIVE
Hgb urine dipstick: NEGATIVE
Ketones, ur: 15 — AB
Nitrite: POSITIVE — AB
Protein, ur: 100 — AB
Specific Gravity, Urine: 1.035 — ABNORMAL HIGH
Urobilinogen, UA: 1
pH: 5.5

## 2011-05-12 LAB — PROTIME-INR
INR: 1.2
INR: 1.3
Prothrombin Time: 15.3 — ABNORMAL HIGH
Prothrombin Time: 16.2 — ABNORMAL HIGH

## 2011-05-12 LAB — APTT
aPTT: 28
aPTT: 31

## 2011-05-12 LAB — URINE MICROSCOPIC-ADD ON

## 2011-05-12 LAB — CLOTEST (H. PYLORI), BIOPSY: Helicobacter screen: NEGATIVE

## 2011-05-12 LAB — LIPASE, BLOOD: Lipase: 23

## 2011-05-30 LAB — CBC
HCT: 43
Hemoglobin: 15.2
MCHC: 35.4
MCV: 93.1
Platelets: 341
RBC: 4.62
RDW: 16.9 — ABNORMAL HIGH
WBC: 8

## 2011-05-30 LAB — BASIC METABOLIC PANEL
BUN: 22
CO2: 29
Calcium: 9.6
Chloride: 99
Creatinine, Ser: 1.14
GFR calc Af Amer: >60
GFR calc non Af Amer: >60
Glucose, Bld: 118 — ABNORMAL HIGH
Potassium: 4.6
Sodium: 137

## 2011-08-31 ENCOUNTER — Emergency Department (INDEPENDENT_AMBULATORY_CARE_PROVIDER_SITE_OTHER): Payer: BC Managed Care – PPO

## 2011-08-31 ENCOUNTER — Encounter (HOSPITAL_COMMUNITY): Payer: Self-pay

## 2011-08-31 ENCOUNTER — Emergency Department (HOSPITAL_COMMUNITY)
Admission: EM | Admit: 2011-08-31 | Discharge: 2011-08-31 | Disposition: A | Discharge status: Home / Self Care | Payer: BC Managed Care – PPO | Source: Home / Self Care | Attending: Emergency Medicine | Admitting: Emergency Medicine

## 2011-08-31 DIAGNOSIS — S51809A Unspecified open wound of unspecified forearm, initial encounter: Secondary | ICD-10-CM

## 2011-08-31 DIAGNOSIS — S51811A Laceration without foreign body of right forearm, initial encounter: Secondary | ICD-10-CM

## 2011-08-31 HISTORY — DX: Autoimmune hepatitis: K75.4

## 2011-08-31 MED ORDER — CEFTRIAXONE SODIUM 1 G IJ SOLR
1.0000 g | Freq: Once | INTRAMUSCULAR | Status: AC
  Administered 2011-08-31: 1 g via INTRAMUSCULAR

## 2011-08-31 MED ORDER — LIDOCAINE HCL (PF) 1 % IJ SOLN
INTRAMUSCULAR | Status: AC
  Filled 2011-08-31: qty 5

## 2011-08-31 MED ORDER — CEFTRIAXONE SODIUM 1 G IJ SOLR
INTRAMUSCULAR | Status: AC
  Filled 2011-08-31: qty 10

## 2011-08-31 MED ORDER — CEPHALEXIN 500 MG PO CAPS: 500.0000 mg | ORAL_CAPSULE | Freq: Three times a day (TID) | ORAL | Status: AC

## 2011-08-31 MED ORDER — HYDROCODONE-ACETAMINOPHEN 5-325 MG PO TABS: ORAL_TABLET | ORAL | Status: AC

## 2011-08-31 MED ORDER — TETANUS-DIPHTH-ACELL PERTUSSIS 5-2.5-18.5 LF-MCG/0.5 IM SUSP
INTRAMUSCULAR | Status: AC
  Filled 2011-08-31: qty 0.5

## 2011-08-31 MED ORDER — TETANUS-DIPHTH-ACELL PERTUSSIS 5-2.5-18.5 LF-MCG/0.5 IM SUSP
0.5000 mL | Freq: Once | INTRAMUSCULAR | Status: AC
  Administered 2011-08-31: 0.5 mL via INTRAMUSCULAR

## 2011-08-31 NOTE — ED Notes (Signed)
Pt states he fell off his tailgate of his truck and hit arm on boat trailor. Had large laceration to rt forearm

## 2011-08-31 NOTE — ED Provider Notes (Signed)
Chief Complaint  Patient presents with  . Laceration    laceraton to rt forearm    History of Present Illness:  The patient lacerated his right forearm on a trailer hitch this afternoon. He fell landing on a trailer hitch and the stain a very large flap laceration of the right forearm. He can't recall when his last tetanus was. He's able to move all his digits and denies any numbness or tingling.  Review of Systems:  Other than noted above, the patient denies any of the following symptoms: Systemic:  No fever or chills. Musculoskeletal:  No joint pain or decreased range of motion. Neuro:  No numbness, tingling, or weakness.  PMFSH:  Past medical history, family history, social history, meds, and allergies were reviewed.  Physical Exam:   Vital signs:  BP 146/86  Pulse 84  Temp(Src) 97.3 F (36.3 C) (Oral)  Resp 18  SpO2 98% Ext:  He has a large flap laceration on the right dorsal forearm. Each leg of the flap measures 10 cm. Flap was completely separated from the underlying muscular tissue. It's not bleeding a lot. It's not contaminated. There no visible foreign bodies. The wound was fairly jagged and contained a deal of devitalized tissue. The laceration does not extend into the muscle tissue.  All joints had a full ROM without pain.  Pulses were full.  Good capillary refill in all digits.  No edema. Neurological:  Alert and oriented.  No muscle weakness.  Sensation was intact to light touch.   Procedure: Verbal informed consent was obtained.  The patient was informed of the risks and benefits of the procedure and understands and accepts.  Identity of the patient was verified verbally and by wristband.   The laceration area described above was prepped with Betadine, irrigated copiously with saline,  and anesthetized with 20 mL of 2% Xylocaine without epinephrine.  The wound was then closed as follows:  The wound was debrided of all devitalized tissue and the edges were freshened up. A  corner stitch was placed at the vertex of the angle and the 2 legs were approximated loosely with 4-0 nylon sutures. Total 20 sutures were used.  There were no immediate complications, and the patient tolerated the procedure well. A sterile pressure dressing was applied and with bacitracin  ointment.  Medications given in [facility]:  He was given Tdap vaccine and ceftriaxone 1 g IM because of the risk of infection.  Dg Forearm Right  08/31/2011  *RADIOLOGY REPORT*  Clinical Data: Status post fall.  Laceration.  RIGHT FOREARM - 2 VIEW  Comparison: None.  Findings: A large soft tissue defect is seen along the dorsal, radial aspect of the forearm with soft tissue gas consistent with laceration.  No fracture, dislocation or foreign body.  IMPRESSION: Laceration without fracture or foreign body.  Original Report Authenticated By: Bernadene Bell. Maricela Curet, M.D.   Assessment:   Diagnoses that have been ruled out:  None  Diagnoses that are still under consideration:  None  Final diagnoses:  Laceration of right forearm    Plan:   1.  The following meds were prescribed:   New Prescriptions   CEPHALEXIN (KEFLEX) 500 MG CAPSULE    Take 1 capsule (500 mg total) by mouth 3 (three) times daily.   HYDROCODONE-ACETAMINOPHEN (NORCO) 5-325 MG PER TABLET    1 to 2 tabs every 4 to 6 hours as needed for pain.   2.  The patient was instructed in wound care and pain control,  and handouts were given. 3.  The patient was told to return in 2 days for wound recheck or sooner if any sign of infection. 4.   I think the stitches need to stay in for a total of 2 weeks since this was such a large and deep laceration. We'll need to check him subsequently every 2-3 days to watch for development of infection. He was placed on cephalexin. I'll see him again in 2 days.     Roque Lias, MD 08/31/11 [phone]

## 2011-09-02 ENCOUNTER — Encounter (HOSPITAL_COMMUNITY): Payer: Self-pay

## 2011-09-02 ENCOUNTER — Emergency Department (INDEPENDENT_AMBULATORY_CARE_PROVIDER_SITE_OTHER)
Admission: EM | Admit: 2011-09-02 | Discharge: 2011-09-02 | Disposition: A | Discharge status: Home / Self Care | Payer: BC Managed Care – PPO | Source: Home / Self Care | Attending: Emergency Medicine | Admitting: Emergency Medicine

## 2011-09-02 DIAGNOSIS — X58XXXA Exposure to other specified factors, initial encounter: Secondary | ICD-10-CM

## 2011-09-02 DIAGNOSIS — IMO0002 Reserved for concepts with insufficient information to code with codable children: Secondary | ICD-10-CM

## 2011-09-02 DIAGNOSIS — T148XXA Other injury of unspecified body region, initial encounter: Secondary | ICD-10-CM

## 2011-09-02 NOTE — ED Provider Notes (Signed)
Chief Complaint  Patient presents with  . Laceration    History of Present Illness:  The patient returns for followup on his laceration on the right arm. Had to redress it once. It is a little bit sore but not severely painful. He denies any fever or chills. He's able to move all his digits well and he denies any numbness or weakness in the hand.  Review of Systems:  Other than noted above, the patient denies any of the following symptoms: Systemic:  No fevers, chills, sweats, or aches.  No fatigue or tiredness. Musculoskeletal:  No joint pain, arthritis, bursitis, swelling, back pain, or neck pain. Neurological:  No muscular weakness, paresthesias, headache, or trouble with speech or coordination.  No dizziness.   PMFSH:  Past medical history, family history, social history, meds, and allergies were reviewed.  Physical Exam:   Vital signs:  BP 132/89  Pulse 114  Temp(Src) 98.3 F (36.8 C) (Oral)  Resp 20  SpO2 96% Gen:  Alert and oriented times 3.  In no distress. Musculoskeletal: The dressing was removed and the wound is inspected and it appears to be healing well. There is no evidence of infection. No redness, swelling, or purulent drainage. Otherwise, all joints had a full a ROM with no swelling, bruising or deformity.  No edema, pulses full. Extremities were warm and pink.  Capillary refill was brisk.  Skin:  Clear, warm and dry.  No rash. Neuro:  Alert and oriented times 3.  Muscle strength was normal.  Sensation was intact to light touch.   08/31/2011  *RADIOLOGY REPORT*  Clinical Data: Status post fall.  Laceration.  RIGHT FOREARM - 2 VIEW  Comparison: None.  Findings: A large soft tissue defect is seen along the dorsal, radial aspect of the forearm with soft tissue gas consistent with laceration.  No fracture, dislocation or foreign body.  IMPRESSION: Laceration without fracture or foreign body.  Original Report Authenticated By: Bernadene Bell. Maricela Curet, M.D.    Medications given in  [facility]:  None  Assessment:   Diagnoses that have been ruled out:  None  Diagnoses that are still under consideration:  None  Final diagnoses:  Laceration    Plan:   1.  The following meds were prescribed:   New Prescriptions   No medications on file   2.  The patient was instructed in symptomatic care, including rest and activity, elevation, application of ice and compression.  Appropriate handouts were given. 3.  The patient was told to return if becoming worse in any way, if no better in 3 or 4 days, and given some red flag symptoms that would indicate earlier return.   4.  The patient was told to follow up in 3-4 days. He was instructed in wound care again. He may wash gently with soap and water, pat dry, apply antibiotic ointment, and a nonstick dressing followed by roll gauze. I would like to keep the stitches in for a total of 2 weeks. He was instructed to report immediately any evidence of infection.   Roque Lias, MD 09/02/11 [phone]

## 2011-09-02 NOTE — ED Notes (Signed)
Pt seen here 2 days ago for repair of laceration to rt forearm.  Here for recheck as directed.  Denies concerns

## 2011-09-14 ENCOUNTER — Emergency Department (INDEPENDENT_AMBULATORY_CARE_PROVIDER_SITE_OTHER)
Admission: EM | Admit: 2011-09-14 | Discharge: 2011-09-14 | Disposition: A | Discharge status: Home / Self Care | Payer: BC Managed Care – PPO | Source: Home / Self Care | Attending: Emergency Medicine | Admitting: Emergency Medicine

## 2011-09-14 ENCOUNTER — Encounter (HOSPITAL_COMMUNITY): Payer: Self-pay | Admitting: *Deleted

## 2011-09-14 DIAGNOSIS — T148XXA Other injury of unspecified body region, initial encounter: Secondary | ICD-10-CM

## 2011-09-14 DIAGNOSIS — IMO0002 Reserved for concepts with insufficient information to code with codable children: Secondary | ICD-10-CM

## 2011-09-14 DIAGNOSIS — X58XXXA Exposure to other specified factors, initial encounter: Secondary | ICD-10-CM

## 2011-09-14 NOTE — ED Provider Notes (Signed)
Chief Complaint  Patient presents with  . Suture / Staple Removal    History of Present Illness:  The patient returns today for suture removal. He had a large flap laceration on his right forearm that was sutured 2 weeks ago by me. It seems to be healing well so far. There's a little bit of inflammation around the sutures, but no evidence of infection. He denies any fever or chills.  Review of Systems:  Other than noted above, the patient denies any of the following symptoms: Systemic:  No fevers, chills, sweats, weight loss or gain, fatigue, or tiredness. Skin: No rash or itching. Extremities: No edema, numbness, tingling, or weakness.   PMFSH:  Past medical history, family history, social history, meds, and allergies were reviewed.  Physical Exam:   Vital signs:  BP 147/92  Pulse 85  Temp(Src) 97.5 F (36.4 C) (Oral)  Resp 16  SpO2 96% General:  Alert and oriented.  In no distress.  Skin warm and dry. Extremities: His wound is healing up well other than a little inflammation around the sutures. There is no evidence of dehiscence or infection. He has a full range of motion of all joints. Pulses are full. Good capillary refill.  Radiology:  Dg Forearm Right  08/31/2011  *RADIOLOGY REPORT*  Clinical Data: Status post fall.  Laceration.  RIGHT FOREARM - 2 VIEW  Comparison: None.  Findings: A large soft tissue defect is seen along the dorsal, radial aspect of the forearm with soft tissue gas consistent with laceration.  No fracture, dislocation or foreign body.  IMPRESSION: Laceration without fracture or foreign body.  Original Report Authenticated By: Bernadene Bell. Maricela Curet, M.D.    Medications given in [facility]:  None  Assessment:   Diagnoses that have been ruled out:  None  Diagnoses that are still under consideration:  None  Final diagnoses:  Laceration    Plan:   1. sutures were removed, Steri-Strips were applied, and the wound was redressed. He was instructed in wound care and  should return again as needed. 2.  The patient was instructed in symptomatic care and handouts were given.    Roque Lias, MD 09/14/11 [phone]

## 2011-09-14 NOTE — ED Notes (Signed)
Sutures placed to right forearm 2 wks ago here, had tetanus at that time, redness noted at suture line

## 2013-06-07 ENCOUNTER — Other Ambulatory Visit (HOSPITAL_COMMUNITY): Payer: Self-pay | Admitting: Family Medicine

## 2013-06-07 DIAGNOSIS — Z79899 Other long term (current) drug therapy: Secondary | ICD-10-CM

## 2013-06-14 ENCOUNTER — Ambulatory Visit (HOSPITAL_COMMUNITY)
Admission: RE | Admit: 2013-06-14 | Discharge: 2013-06-14 | Disposition: A | Discharge status: Home / Self Care | Payer: BC Managed Care – HMO | Source: Ambulatory Visit | Attending: Family Medicine | Admitting: Family Medicine

## 2013-06-14 DIAGNOSIS — Z13820 Encounter for screening for osteoporosis: Secondary | ICD-10-CM | POA: Insufficient documentation

## 2013-06-14 DIAGNOSIS — Z79899 Other long term (current) drug therapy: Secondary | ICD-10-CM

## 2014-05-10 ENCOUNTER — Other Ambulatory Visit: Payer: Self-pay | Admitting: Dermatology

## 2014-09-18 ENCOUNTER — Encounter: Payer: Self-pay | Admitting: Internal Medicine

## 2015-08-01 ENCOUNTER — Encounter: Payer: Self-pay | Admitting: Internal Medicine

## 2016-05-19 ENCOUNTER — Emergency Department (HOSPITAL_COMMUNITY)
Admission: EM | Admit: 2016-05-19 | Discharge: 2016-05-19 | Disposition: A | Discharge status: Home / Self Care | Payer: Medicare Other | Attending: Emergency Medicine | Admitting: Emergency Medicine

## 2016-05-19 ENCOUNTER — Encounter (HOSPITAL_COMMUNITY): Payer: Self-pay | Admitting: Emergency Medicine

## 2016-05-19 ENCOUNTER — Ambulatory Visit (HOSPITAL_COMMUNITY): Payer: Medicare Other

## 2016-05-19 DIAGNOSIS — G527 Disorders of multiple cranial nerves: Secondary | ICD-10-CM | POA: Diagnosis not present

## 2016-05-19 DIAGNOSIS — Z7984 Long term (current) use of oral hypoglycemic drugs: Secondary | ICD-10-CM | POA: Diagnosis not present

## 2016-05-19 DIAGNOSIS — Z79899 Other long term (current) drug therapy: Secondary | ICD-10-CM | POA: Insufficient documentation

## 2016-05-19 DIAGNOSIS — E119 Type 2 diabetes mellitus without complications: Secondary | ICD-10-CM | POA: Diagnosis not present

## 2016-05-19 DIAGNOSIS — H532 Diplopia: Secondary | ICD-10-CM

## 2016-05-19 DIAGNOSIS — H4921 Sixth [abducent] nerve palsy, right eye: Secondary | ICD-10-CM

## 2016-05-19 DIAGNOSIS — H538 Other visual disturbances: Secondary | ICD-10-CM | POA: Diagnosis present

## 2016-05-19 HISTORY — DX: Type 2 diabetes mellitus without complications: E11.9

## 2016-05-19 NOTE — ED Triage Notes (Signed)
Pt c/o bilateral blurred vision for about one week. Pt states he went to optamolog[facility] and stated he had allergies but unsure of why blurry vision. [facility] but [facility] not [facility]. Pt has Type 2 diabetes and states CBG this am was 164. Pt states this has been his usual lately.

## 2016-05-19 NOTE — ED Provider Notes (Signed)
[facility] DEPT Provider Note   CSN: [id number] Arrival date & time: 05/19/16  1427     History   Chief Complaint Chief Complaint  Patient presents with  . Blurred Vision    HPI Bryan Wang is a 65 y.o. male.Complains of double vision onset 1 week ago. He denies blurred vision. Symptoms not made better or worse by anything. No treatment prior to coming here. Patient reports that he was evaluated by an eye doctor earlier this week, who determined etiology of diplopia unclear. No focal numbness or weakness. No other associated symptoms. Nothing makes symptoms better or worse  HPI  Past Medical History:  Diagnosis Date  . Autoimmune hepatitis ([place])   . Diabetes mellitus without complication ([place])     There are no active problems to display for this patient.   Past Surgical History:  Procedure Laterality Date  . FACIAL FRACTURE SURGERY    . NECK SURGERY         Home Medications    Prior to Admission medications   Medication Sig Start Date End Date Taking? Authorizing Provider  azaTHIOprine (IMURAN) 50 MG tablet Take 200 mg by mouth daily.    Yes Historical Provider, MD  budesonide (ENTOCORT EC) 3 MG 24 hr capsule Take 9 mg by mouth daily.    Yes Historical Provider, MD  glipiZIDE (GLUCOTROL) 10 MG tablet Take 15 mg by mouth 2 (two) times daily.   Yes Historical Provider, MD  latanoprost (XALATAN) 0.005 % ophthalmic solution 1 drop nightly. Both eyes   Yes Historical Provider, MD  lisinopril (PRINIVIL,ZESTRIL) 2.5 MG tablet Take 2.5 mg by mouth daily.   Yes Historical Provider, MD  loratadine (CLARITIN) 10 MG tablet Take 10 mg by mouth daily.    Yes Historical Provider, MD  metFORMIN (GLUCOPHAGE) 1000 MG tablet Take 1,000 mg by mouth at bedtime.    Yes Historical Provider, MD  methylphenidate 27 MG PO CR tablet Take 27 mg by mouth daily.    Yes Historical Provider, MD  neomycin-polymyxin b-dexamethasone (MAXITROL) 3.5-10000-0.1 SUSP Place 1 drop into both eyes 4  (four) times daily. ABT Start Date 05/15/16 & End Date 05/22/16   Yes Historical Provider, MD    Family History No family history on file.  Social History Social History  Substance Use Topics  . Smoking status: Never Smoker  . Smokeless tobacco: Not on file  . Alcohol use No    No illicit drug use Allergies   Review of patient's allergies indicates no known allergies.   Review of Systems Review of Systems  Constitutional: Negative.   HENT: Negative.   Eyes: Positive for visual disturbance.  Respiratory: Negative.   Cardiovascular: Negative.   Gastrointestinal: Negative.   Musculoskeletal: Negative.   Skin: Negative.   Allergic/Immunologic: Positive for immunocompromised state.       Diabetic  Neurological: Negative.   Psychiatric/Behavioral: Negative.   All other systems reviewed and are negative.    Physical Exam Updated Vital Signs BP 151/75 (BP Location: Left Arm)   Pulse 93   Temp 97.9 F (36.6 C) (Oral)   Resp 18   Ht 5\' 7"  (1.702 m)   Wt 260 lb (117.9 kg)   SpO2 100%   BMI 40.72 kg/m   Physical Exam  Constitutional: He is oriented to person, place, and time. He appears well-developed and well-nourished.  HENT:  Head: Normocephalic and atraumatic.  Eyes: Conjunctivae are normal. Pupils are equal, round, and reactive to light.  Right-sided sixth cranial nerve  palsy  Neck: Neck supple. No tracheal deviation present. No thyromegaly present.  Cardiovascular: Normal rate and regular rhythm.   No murmur heard. Pulmonary/Chest: Effort normal and breath sounds normal.  Abdominal: Soft. Bowel sounds are normal. He exhibits no distension. There is no tenderness.  Musculoskeletal: Normal range of motion. He exhibits no edema or tenderness.  Neurological: He is alert and oriented to person, place, and time. Coordination normal.  Right-sided sixth cranial nerve palsy other cranial nerves II through XII grossly intact. Romberg normal pronator drift normal gait  normal DTRs symmetric bilaterally knee jerk ankle jerk and biceps toes downward going bilaterally  Skin: Skin is warm and dry. No rash noted.  Psychiatric: He has a normal mood and affect.  Nursing note and vitals reviewed.    ED Treatments / Results  Labs (all labs ordered are listed, but only abnormal results are displayed) Labs Reviewed - No data to display  EKG  EKG Interpretation None       Radiology No results found.  Procedures Procedures (including critical care time)  Medications Ordered in ED Medications - No data to display  Results for orders placed or performed during the hospital encounter of 06/28/10  CBC  Result Value Ref Range   WBC 9.2 4.0 - 10.5 K/uL   RBC 4.86 4.22 - 5.81 MIL/uL   Hemoglobin 15.5 13.0 - 17.0 g/dL   HCT 44.4 39.0 - 52.0 %   MCV 91.4 78.0 - 100.0 fL   MCH 32.0 26.0 - 34.0 pg   MCHC 35.0 30.0 - 36.0 g/dL   RDW 16.4 (H) 11.5 - 15.5 %   Platelets 306 150 - 400 K/uL  Protime-INR  Result Value Ref Range   Prothrombin Time 13.2 11.6 - 15.2 seconds   INR 0.98 0.00 - 1.49  APTT  Result Value Ref Range   aPTT 28 24 - 37 seconds   Mr Brain Wo Contrast  Result Date: 05/19/2016 CLINICAL DATA:  Blurry vision for 1 week. EXAM: MRI HEAD WITHOUT CONTRAST TECHNIQUE: Multiplanar, multiecho pulse sequences of the brain and surrounding structures were obtained without intravenous contrast. COMPARISON:  None. FINDINGS: Brain: No acute infarct or intraparenchymal hemorrhage. The midline structures are normal. There is bifrontal encephalomalacia in a pattern suggestive of prior traumatic injury. There is multifocal hyperintense T2-weighted signal within the periventricular and deep white matter, most often seen in the setting of chronic microvascular ischemia. No mass lesion or midline shift. No hydrocephalus or extra-axial fluid collection. Vascular: Major intracranial flow voids are preserved. No evidence of chronic microhemorrhage or amyloid  angiopathy. Skull and upper cervical spine: The visualized skull base, calvarium, upper cervical spine and extracranial soft tissues are normal. Sinuses/Orbits: No fluid levels or advanced mucosal thickening. No mastoid effusion. Normal orbits. Bilateral lens replacements. IMPRESSION: 1. No findings to explain the reported visual disturbances. 2. 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 3. Mild findings of chronic microvascular ischemia. Electronically Signed   By: Ulyses Jarred M.D.   On: 05/19/2016 18:50   Initial Impression / Assessment and Plan / ED Course  I have reviewed the triage vital signs and the nursing notes.  Pertinent labs & imaging results that were available during my care of the patient were reviewed by me and considered in my medical decision making (see chart for details).  Clinical Course   7:10 PM exam unchanged. Patient resting comfortably. Case discussed with Dr. Posey Pronto plan follow-up in office outpatient no further treatment needed  Final Clinical Impressions(s) / ED Diagnoses  Diagnosis right-sided sixth cranial nerve palsy Final diagnoses:  None    New Prescriptions New Prescriptions   No medications on file     Orlie Dakin, MD 05/19/16 [id number]

## 2016-05-19 NOTE — ED Notes (Signed)
Patient transported to MRI 

## 2016-05-21 ENCOUNTER — Telehealth: Payer: Self-pay | Admitting: *Deleted

## 2016-05-21 NOTE — Telephone Encounter (Signed)
Office called needing ED Provider noted faxed to their office.

## 2018-04-12 IMAGING — MR MR HEAD W/O CM
8 of 10 series · 38 of 48 positions shown · non-contrast
Comparison: None.

CLINICAL DATA: Blurry vision for 1 week.

EXAM:
MRI HEAD WITHOUT CONTRAST
TECHNIQUE: Multiplanar, multiecho pulse sequences of the brain and surrounding
structures were obtained without intravenous contrast.

[Series 3: T1 · sagittal · 5.0mm · 0.47mm/px · 1 of 27 slices shown]
[im 1/27]
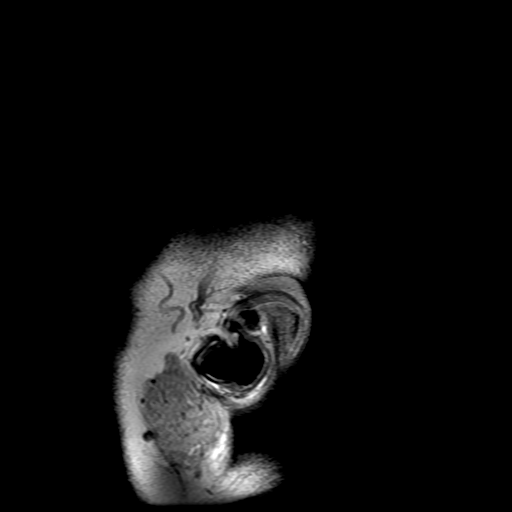

[Series 4: DWI · axial · 3.0mm · 1.09mm/px · z∈[-108,+56]mm · 11 of 112 slices shown (1 of 4)]
[im 1/112]
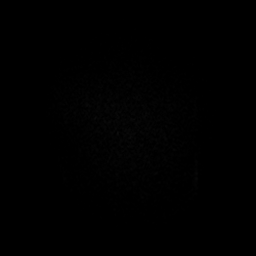
[im 12/112]
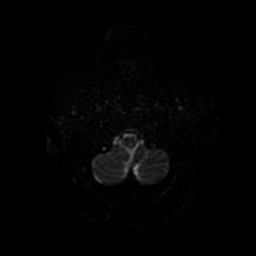
[im 23/112]
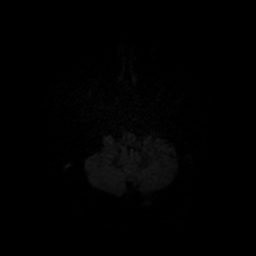
[im 34/112]
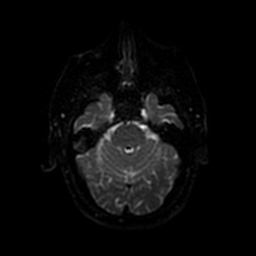
[im 45/112]
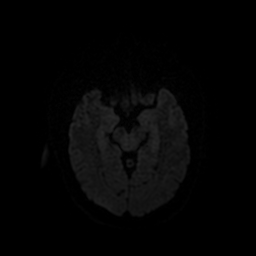
[im 56/112]
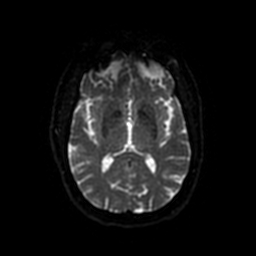
[im 67/112]
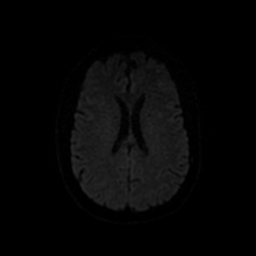
[im 78/112]
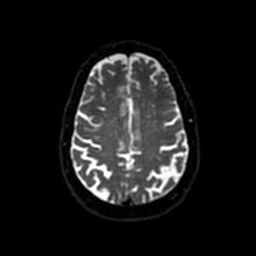
[im 89/112]
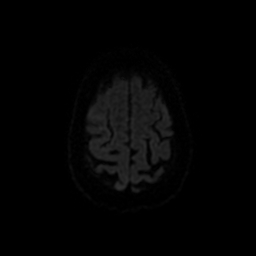
[im 100/112]
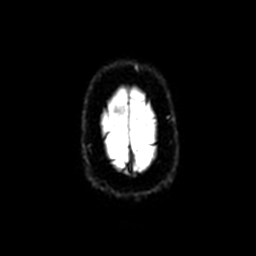
[im 112/112]
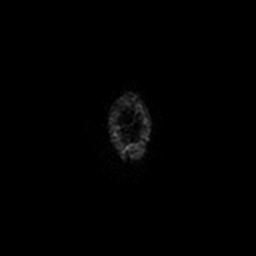

[Series 5: DWI · coronal · 5.0mm · 1.09mm/px · 8 of 82 slices shown (2 of 4)]
[im 1/82]
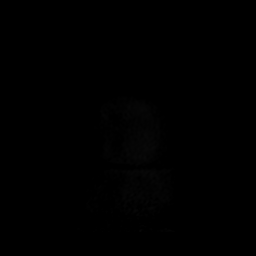
[im 12/82]
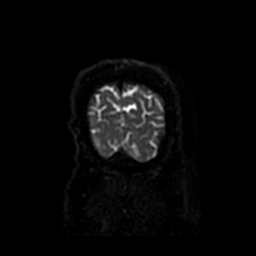
[im 24/82]
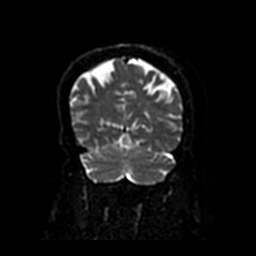
[im 35/82]
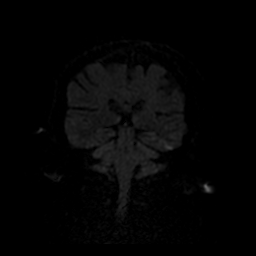
[im 47/82]
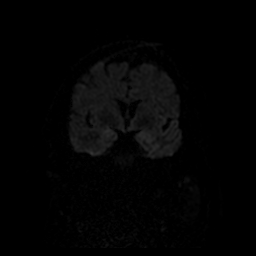
[im 58/82]
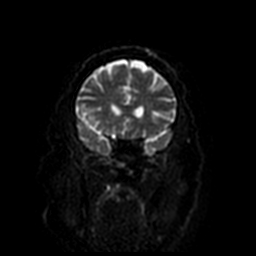
[im 70/82]
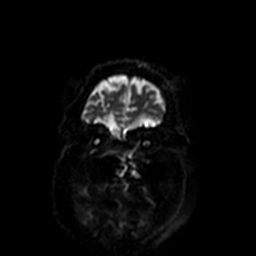
[im 82/82]
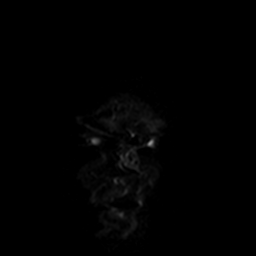

[Series 6: T2 · axial · 5.0mm · 0.43mm/px · z∈[-105,+63]mm · 3 of 27 slices shown]
[im 1/27]
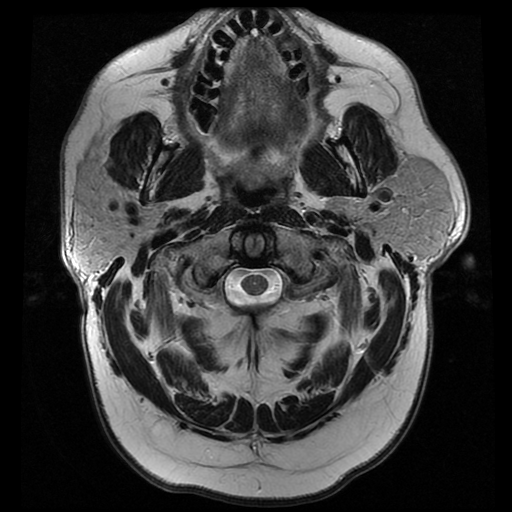
[im 14/27]
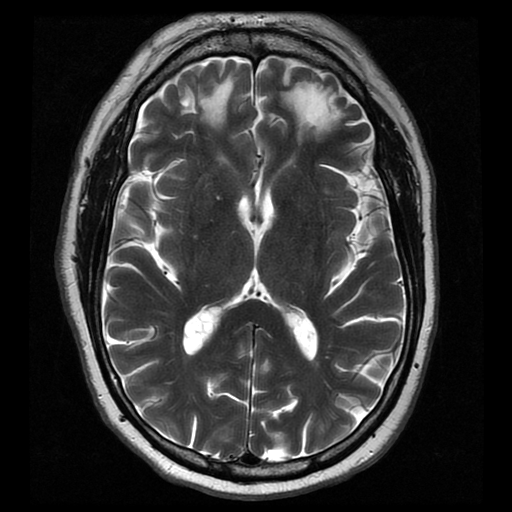
[im 27/27]
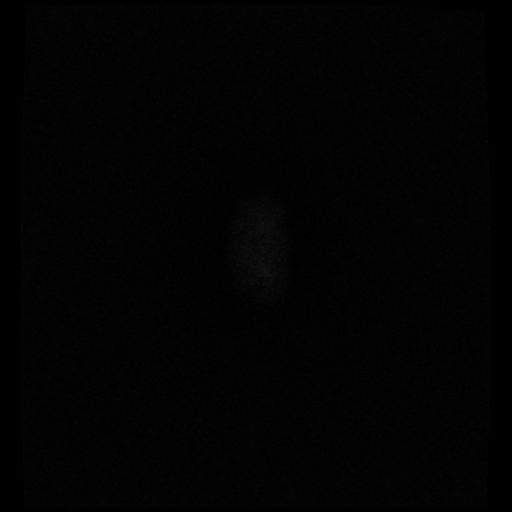

[Series 7: FLAIR · axial · 5.0mm · 0.43mm/px · z∈[-111,+69]mm · 3 of 27 slices shown]
[im 1/27]
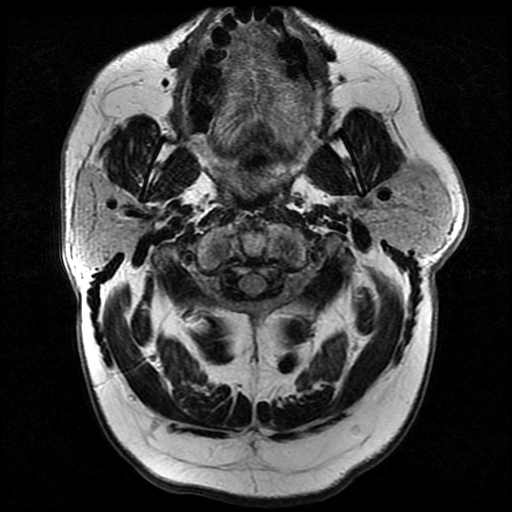
[im 14/27]
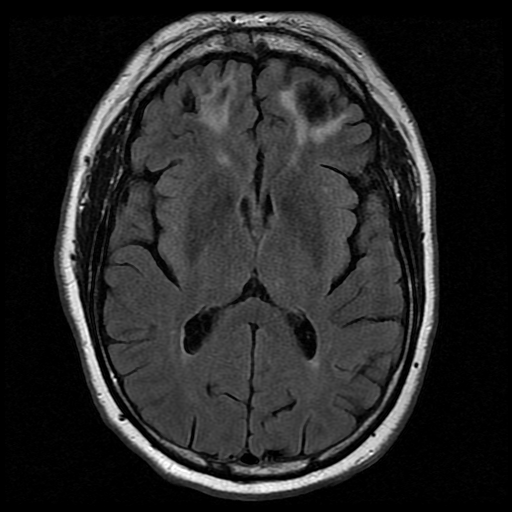
[im 27/27]
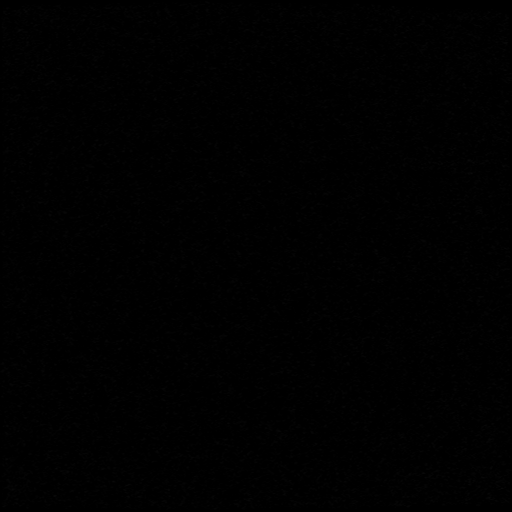

[Series 10: T2 post-contrast · coronal · 5.0mm · 0.45mm/px · 3 of 33 slices shown]
[im 1/33]
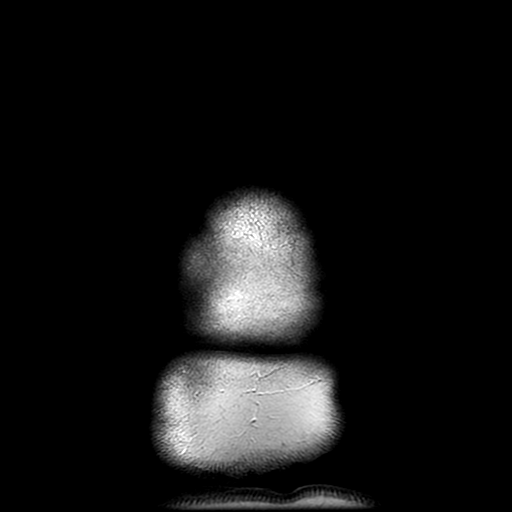
[im 17/33]
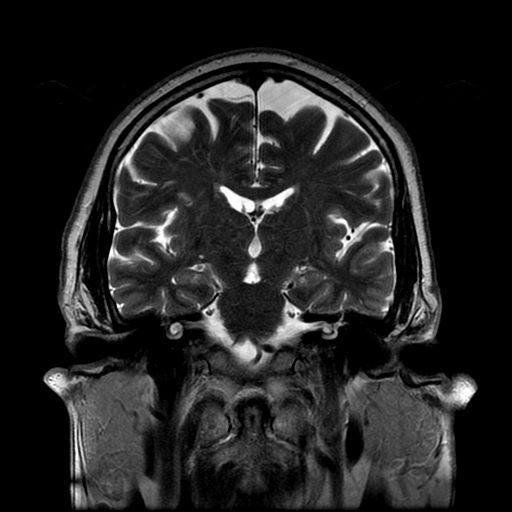
[im 33/33]
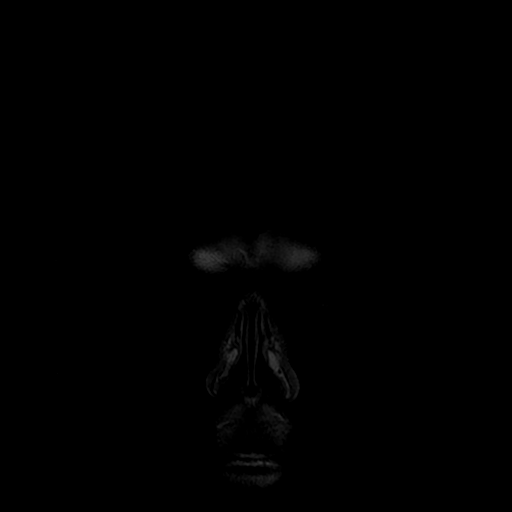

[Series 400: DWI · axial · 3.0mm · 1.09mm/px · z∈[-108,+56]mm · 5 of 56 slices shown (3 of 4)]
[im 1/56]
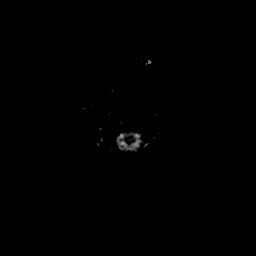
[im 14/56]
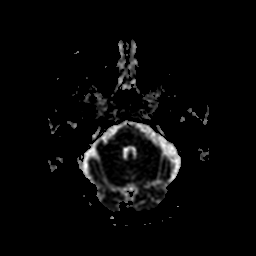
[im 28/56]
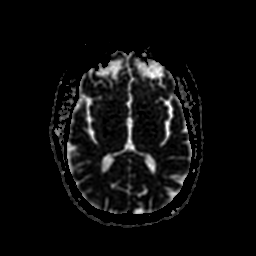
[im 42/56]
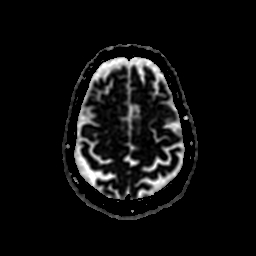
[im 56/56]
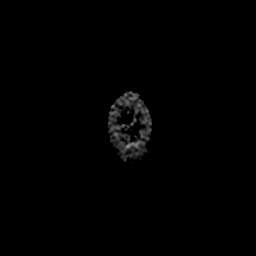

[Series 500: DWI · coronal · 5.0mm · 1.09mm/px · 4 of 41 slices shown (4 of 4)]
[im 1/41]
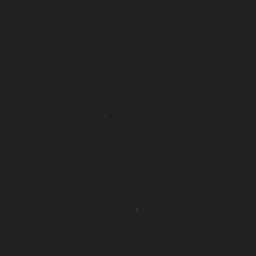
[im 14/41]
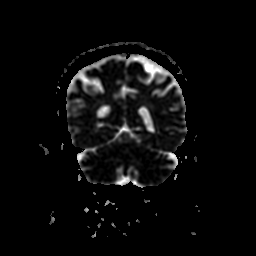
[im 27/41]
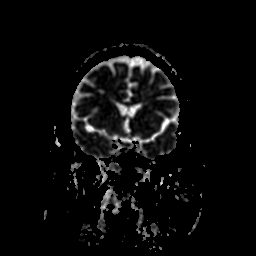
[im 41/41]
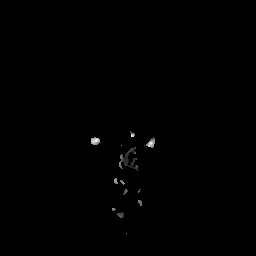

[38 of 48 positions shown; findings below may reference images not displayed]

FINDINGS: Brain: No acute infarct or intraparenchymal hemorrhage. The midline
structures are normal. There is bifrontal encephalomalacia in a
pattern suggestive of prior traumatic injury. There is multifocal
hyperintense T2-weighted signal within the periventricular and deep
white matter, most often seen in the setting of chronic
microvascular ischemia. No mass lesion or midline shift. No
hydrocephalus or extra-axial fluid collection.

Vascular: Major intracranial flow voids are preserved. No evidence
of chronic microhemorrhage or amyloid angiopathy.

Skull and upper cervical spine: The visualized skull base,
calvarium, upper cervical spine and extracranial soft tissues are
normal.

Sinuses/Orbits: No fluid levels or advanced mucosal thickening. No
mastoid effusion. Normal orbits. Bilateral lens replacements.
IMPRESSION: 1. No findings to explain the reported visual disturbances.
2. Bifrontal encephalomalacia in a pattern most suggestive of remote
traumatic injury.
3. Mild findings of chronic microvascular ischemia.

## 2019-11-19 ENCOUNTER — Encounter (HOSPITAL_BASED_OUTPATIENT_CLINIC_OR_DEPARTMENT_OTHER): Payer: Self-pay | Admitting: Emergency Medicine

## 2019-11-19 ENCOUNTER — Emergency Department (HOSPITAL_BASED_OUTPATIENT_CLINIC_OR_DEPARTMENT_OTHER)
Admission: EM | Admit: 2019-11-19 | Discharge: 2019-11-19 | Disposition: A | Discharge status: Home / Self Care | Payer: Medicare Other | Attending: Emergency Medicine | Admitting: Emergency Medicine

## 2019-11-19 ENCOUNTER — Other Ambulatory Visit: Payer: Self-pay

## 2019-11-19 DIAGNOSIS — E119 Type 2 diabetes mellitus without complications: Secondary | ICD-10-CM | POA: Diagnosis not present

## 2019-11-19 DIAGNOSIS — W228XXA Striking against or struck by other objects, initial encounter: Secondary | ICD-10-CM | POA: Diagnosis not present

## 2019-11-19 DIAGNOSIS — S8992XA Unspecified injury of left lower leg, initial encounter: Secondary | ICD-10-CM | POA: Diagnosis present

## 2019-11-19 DIAGNOSIS — Z23 Encounter for immunization: Secondary | ICD-10-CM | POA: Diagnosis not present

## 2019-11-19 DIAGNOSIS — Y92015 Private garage of single-family (private) house as the place of occurrence of the external cause: Secondary | ICD-10-CM | POA: Insufficient documentation

## 2019-11-19 DIAGNOSIS — Z7984 Long term (current) use of oral hypoglycemic drugs: Secondary | ICD-10-CM | POA: Insufficient documentation

## 2019-11-19 DIAGNOSIS — Y998 Other external cause status: Secondary | ICD-10-CM | POA: Insufficient documentation

## 2019-11-19 DIAGNOSIS — Y9389 Activity, other specified: Secondary | ICD-10-CM | POA: Insufficient documentation

## 2019-11-19 DIAGNOSIS — Z79899 Other long term (current) drug therapy: Secondary | ICD-10-CM | POA: Diagnosis not present

## 2019-11-19 DIAGNOSIS — S81812A Laceration without foreign body, left lower leg, initial encounter: Secondary | ICD-10-CM | POA: Insufficient documentation

## 2019-11-19 MED ORDER — HYDROCODONE-ACETAMINOPHEN 5-325 MG PO TABS
2.0000 | ORAL_TABLET | Freq: Once | ORAL | Status: AC
  Administered 2019-11-19: 2 via ORAL
  Filled 2019-11-19: qty 2

## 2019-11-19 MED ORDER — DOXYCYCLINE HYCLATE 100 MG PO TABS
100.0000 mg | ORAL_TABLET | Freq: Once | ORAL | Status: AC
  Administered 2019-11-19: 100 mg via ORAL
  Filled 2019-11-19: qty 1

## 2019-11-19 MED ORDER — DOXYCYCLINE HYCLATE 100 MG PO CAPS: 100.0000 mg | ORAL_CAPSULE | Freq: Two times a day (BID) | ORAL | 0 refills | Status: AC

## 2019-11-19 MED ORDER — LIDOCAINE HCL (PF) 1 % IJ SOLN
30.0000 mL | Freq: Once | INTRAMUSCULAR | Status: AC
  Administered 2019-11-19: 30 mL
  Filled 2019-11-19: qty 30

## 2019-11-19 MED ORDER — ONDANSETRON 4 MG PO TBDP
4.0000 mg | ORAL_TABLET | Freq: Once | ORAL | Status: AC
  Administered 2019-11-19: 4 mg via ORAL
  Filled 2019-11-19: qty 1

## 2019-11-19 MED ORDER — LIDOCAINE HCL (PF) 1 % IJ SOLN
INTRAMUSCULAR | Status: AC
  Filled 2019-11-19: qty 25

## 2019-11-19 MED ORDER — TETANUS-DIPHTH-ACELL PERTUSSIS 5-2.5-18.5 LF-MCG/0.5 IM SUSP
0.5000 mL | Freq: Once | INTRAMUSCULAR | Status: AC
  Administered 2019-11-19: 0.5 mL via INTRAMUSCULAR
  Filled 2019-11-19: qty 0.5

## 2019-11-19 NOTE — Discharge Instructions (Signed)
1. Medications: Tylenol or ibuprofen for pain, usual home medications  2. Treatment: ice for swelling, keep wound clean with warm soap and water and keep bandage dry, do not submerge in water for 24 hours  3. Follow Up: Please return in 10 days to have your stitches/staples removed or sooner if you have concerns. Return to the emergency department for increased redness, drainage of pus from the wound  -I have included information for plastic surgery to follow-up if needed.  As we discussed the wound on your leg is very large and will possibly need a skin graft.   WOUND CARE  Keep area clean and dry for 24 hours. Do not remove bandage, if applied.  After 24 hours, remove bandage and wash wound gently with mild soap and warm water. Reapply a new bandage after cleaning wound, if directed.   Continue daily cleansing with soap and water until stitches/staples are removed.  Do not apply any ointments or creams to the wound while stitches/staples are in place, as this may cause delayed healing. Return if you experience any of the following signs of infection: Swelling, redness, pus drainage, streaking, fever >101.0 F  Return if you experience excessive bleeding that does not stop after 15-20 minutes of constant, firm pressure.

## 2019-11-19 NOTE — ED Provider Notes (Signed)
[facility] EMERGENCY DEPARTMENT Provider Note   CSN: [id number] Arrival date & time: 11/19/19  1848     History Chief Complaint  Patient presents with  . Extremity Laceration    Bryan Wang is a 69 y.o. male with past medical history significant for diabetes, autoimmune hepatitis presents to emergency room today with chief complaint of left leg laceration.  This happened just prior to arrival.  It was in his garage moving a trailer when he accidentally dropped it and the trailer hitch slid down his left shin causing a laceration.  He had immediate pain and bleeding.  He describes the pain as an aching and throbbing sensation.  He rates the pain 7 out of 10 in severity.  He did not take anything for pain prior to arrival.  He has been able to walk and bear weight without any difficulty.  He denies fever, chills, numbness, tingling, decrease sensation, weakness of the left leg.  Tetanus is not up-to-date.  He is not anticoagulated.  History provided by patient with additional history obtained from chart review.        Past Medical History:  Diagnosis Date  . Autoimmune hepatitis ([place])   . Diabetes mellitus without complication ([place])     There are no problems to display for this patient.   Past Surgical History:  Procedure Laterality Date  . FACIAL FRACTURE SURGERY    . NECK SURGERY         History reviewed. No pertinent family history.  Social History   Tobacco Use  . Smoking status: Never Smoker  Substance Use Topics  . Alcohol use: No  . Drug use: No    Home Medications Prior to Admission medications   Medication Sig Start Date End Date Taking? Authorizing Provider  azaTHIOprine (IMURAN) 50 MG tablet Take 200 mg by mouth daily.     [provider]  budesonide (ENTOCORT EC) 3 MG 24 hr capsule Take 9 mg by mouth daily.     [provider]  doxycycline (VIBRAMYCIN) 100 MG capsule Take 1 capsule (100 mg total) by mouth 2 (two) times  daily for 7 days. 11/19/19 11/26/19  Cheyann Blecha E, PA-C  ferrous sulfate 325 (65 FE) MG tablet Take by mouth.    [provider]  glipiZIDE (GLUCOTROL) 10 MG tablet Take 15 mg by mouth 2 (two) times daily.    [provider]  latanoprost (XALATAN) 0.005 % ophthalmic solution 1 drop nightly. Both eyes    [provider]  lisinopril (PRINIVIL,ZESTRIL) 2.5 MG tablet Take 2.5 mg by mouth daily.    [provider]  loratadine (CLARITIN) 10 MG tablet Take 10 mg by mouth daily.     [provider]  metFORMIN (GLUCOPHAGE) 1000 MG tablet Take 1,000 mg by mouth at bedtime.     [provider]  methylphenidate 27 MG PO CR tablet Take 27 mg by mouth daily.     [provider]  neomycin-polymyxin b-dexamethasone (MAXITROL) 3.5-10000-0.1 SUSP Place 1 drop into both eyes 4 (four) times daily. ABT Start Date 05/15/16 & End Date 05/22/16    [provider]  rOPINIRole (REQUIP) 2 MG tablet Take by mouth.    [provider]    Allergies    Patient has no known allergies.  Review of Systems   Review of Systems All other systems are reviewed and are negative for acute change except as noted in the HPI.  Physical Exam Updated Vital Signs BP  121/65 (BP Location: Right Arm)   Pulse 82   Temp 98.4 F (36.9 C) (Oral)   Resp 20   Ht 5\' 7"  (1.702 m)   Wt 127 kg   SpO2 97%   BMI 43.85 kg/m   Physical Exam Vitals and nursing note reviewed.  Constitutional:      General: He is not in acute distress.    Appearance: He is not ill-appearing.  HENT:     Head: Normocephalic and atraumatic.     Right Ear: Tympanic membrane and external ear normal.     Left Ear: Tympanic membrane and external ear normal.     Nose: Nose normal.     Mouth/Throat:     Mouth: Mucous membranes are moist.     Pharynx: Oropharynx is clear.  Eyes:     General: No scleral icterus.       Right eye: No discharge.        Left eye: No discharge.      Extraocular Movements: Extraocular movements intact.     Conjunctiva/sclera: Conjunctivae normal.     Pupils: Pupils are equal, round, and reactive to light.  Neck:     Vascular: No JVD.  Cardiovascular:     Rate and Rhythm: Normal rate and regular rhythm.     Pulses: Normal pulses.          Radial pulses are 2+ on the right side and 2+ on the left side.     Heart sounds: Normal heart sounds.  Pulmonary:     Comments: Lungs clear to auscultation in all fields. Symmetric chest rise. No wheezing, rales, or rhonchi. Abdominal:     Comments: Abdomen is soft, non-distended, and non-tender in all quadrants. No rigidity, no guarding. No peritoneal signs.  Musculoskeletal:        General: Normal range of motion.     Cervical back: Normal range of motion.     Comments: Full range of motion of left knee and ankle.  Able to wiggle all toes.  DP pulse 2+ on the left.  Skin:    General: Skin is warm and dry.     Capillary Refill: Capillary refill takes less than 2 seconds.     Comments: Triangle shaped laceration to left anterior shin.  Please see media below.  Wound measures 9 x 12 x 9 cm.  No active bleeding.  Neurological:     Mental Status: He is oriented to person, place, and time.     GCS: GCS eye subscore is 4. GCS verbal subscore is 5. GCS motor subscore is 6.     Comments: Fluent speech, no facial droop.  Psychiatric:        Behavior: Behavior normal.       ED Results / Procedures / Treatments   Labs (all labs ordered are listed, but only abnormal results are displayed) Labs Reviewed - No data to display  EKG None  Radiology No results found.  Procedures .Marland KitchenLaceration Repair  Date/Time: 11/19/2019 11:29 PM Performed by: Cherre Robins, PA-C Authorized by: Cherre Robins, PA-C   Consent:    Consent obtained:  Verbal   Consent given by:  Patient   Risks discussed:  Infection, poor cosmetic result, poor wound healing and need for additional repair   Alternatives  discussed:  No treatment Anesthesia (see MAR for exact dosages):    Anesthesia method:  Local infiltration   Local anesthetic:  Lidocaine 1% w/o epi Laceration details:    Location:  Leg   Leg location:  L lower leg   Length (cm):  9   Depth (mm):  3 Repair type:    Repair type:  Simple Pre-procedure details:    Preparation:  Patient was prepped and draped in usual sterile fashion Exploration:    Hemostasis achieved with:  Direct pressure   Wound exploration: wound explored through full range of motion and entire depth of wound probed and visualized     Wound extent: no foreign bodies/material noted, no muscle damage noted and no tendon damage noted     Contaminated: no   Treatment:    Area cleansed with:  Saline   Amount of cleaning:  Extensive   Irrigation solution:  Sterile saline   Irrigation volume:  3000 ml   Irrigation method:  Pressure wash   Visualized foreign bodies/material removed: no   Skin repair:    Repair method:  Sutures   Suture size:  4-0   Suture technique:  Simple interrupted   Number of sutures:  8 Approximation:    Approximation:  Loose Post-procedure details:    Dressing:  Non-adherent dressing   Patient tolerance of procedure:  Tolerated well, no immediate complications   (including critical care time)  Medications Ordered in ED Medications  lidocaine (PF) (XYLOCAINE) 1 % injection (has no administration in time range)  HYDROcodone-acetaminophen (NORCO/VICODIN) 5-325 MG per tablet 2 tablet (2 tablets Oral Given 11/19/19 2031)  ondansetron (ZOFRAN-ODT) disintegrating tablet 4 mg (4 mg Oral Given 11/19/19 2032)  lidocaine (PF) (XYLOCAINE) 1 % injection 30 mL (30 mLs Infiltration Given by Other 11/19/19 2102)  Tdap (BOOSTRIX) injection 0.5 mL (0.5 mLs Intramuscular Given 11/19/19 2324)  doxycycline (VIBRA-TABS) tablet 100 mg (100 mg Oral Given 11/19/19 2324)    ED Course  I have reviewed the triage vital signs and the nursing notes.  Pertinent labs &  imaging results that were available during my care of the patient were reviewed by me and considered in my medical decision making (see chart for details).  Clinical Course as of Nov 19 2334  Sat Nov 19, 6134  2653 69 year old male diabetic here with a large V-shaped laceration over his anterior shin of his left leg.  Can be difficult to get full coverage with this as the tissue doesn't mobilize well.  We'll numb up irrigate out well and try some sutures to reapproximate as best as possible.  We'll try to get in with plastic surgery for close follow-up.   [MB]    Clinical Course User Index [MB] Hayden Rasmussen, MD   MDM Rules/Calculators/A&P                      Patient presents to the emergency department with laceration to left shin which occurred within 1 hours PTA. Patient nontoxic appearing, resting comfortably.  Copious pressure irrigation performed. Wound explored and base of wound visualized in a bloodless field without evidence of foreign body. Laceration repair per procedure note above, tolerated well.  Wound was difficult to approximate given the size and that tissue did not mobilize.Tetanus updated at today's visit.  Will cover with antibiotics based on wound not being able to be fully closed and he is diabetic. Discussed suture home care as well as need for wound recheck and suture removal in 10 days. Had lengthy discussion about future wound care and need for possible skin graft. He gets his medical care through the [facility] and plans to follow-up with them.  I did  include information for plastic surgery in case he needs that for follow-up as well.  I discussed results, treatment plan, need for follow-up, and return precautions with the patient including signs of infection. Provided opportunity for questions, patient confirmed understanding and is in agreement with plan. The patient was discussed with and seen by Dr. Melina Copa who agrees with the treatment plan.   Portions of this note were  generated with [employer]. Dictation errors may occur despite best attempts at proofreading.   Final Clinical Impression(s) / ED Diagnoses Final diagnoses:  Laceration of left lower extremity, initial encounter    Rx / DC Orders ED Discharge Orders         Ordered    doxycycline (VIBRAMYCIN) 100 MG capsule  2 times daily     11/19/19 2313           Kortne All, [place] Noe 11/19/19 2336    Hayden Rasmussen, MD 11/20/19 [phone]

## 2019-11-19 NOTE — ED Triage Notes (Signed)
Pt reports Left lower leg laceration 30 minuttes pta. Bleeding controlled.  Pt reports trailer hitch fell onto leg.

## 2020-04-03 ENCOUNTER — Other Ambulatory Visit (INDEPENDENT_AMBULATORY_CARE_PROVIDER_SITE_OTHER): Payer: Self-pay | Admitting: Family Medicine

## 2020-04-03 ENCOUNTER — Ambulatory Visit (INDEPENDENT_AMBULATORY_CARE_PROVIDER_SITE_OTHER): Payer: Medicare Other | Admitting: Family Medicine

## 2020-04-03 ENCOUNTER — Other Ambulatory Visit: Payer: Self-pay

## 2020-04-03 ENCOUNTER — Encounter (INDEPENDENT_AMBULATORY_CARE_PROVIDER_SITE_OTHER): Payer: Self-pay | Admitting: Family Medicine

## 2020-04-03 VITALS — BP 135/78 | HR 100 | Temp 98.2°F | Ht 68.0 in | Wt 299.0 lb

## 2020-04-03 DIAGNOSIS — Z0289 Encounter for other administrative examinations: Secondary | ICD-10-CM

## 2020-04-03 DIAGNOSIS — R030 Elevated blood-pressure reading, without diagnosis of hypertension: Secondary | ICD-10-CM | POA: Diagnosis not present

## 2020-04-03 DIAGNOSIS — E785 Hyperlipidemia, unspecified: Secondary | ICD-10-CM

## 2020-04-03 DIAGNOSIS — G4733 Obstructive sleep apnea (adult) (pediatric): Secondary | ICD-10-CM

## 2020-04-03 DIAGNOSIS — Z9989 Dependence on other enabling machines and devices: Secondary | ICD-10-CM

## 2020-04-03 DIAGNOSIS — R0602 Shortness of breath: Secondary | ICD-10-CM | POA: Diagnosis not present

## 2020-04-03 DIAGNOSIS — E1169 Type 2 diabetes mellitus with other specified complication: Secondary | ICD-10-CM | POA: Diagnosis not present

## 2020-04-03 DIAGNOSIS — R5383 Other fatigue: Secondary | ICD-10-CM

## 2020-04-03 DIAGNOSIS — Z1331 Encounter for screening for depression: Secondary | ICD-10-CM

## 2020-04-03 DIAGNOSIS — Z87891 Personal history of nicotine dependence: Secondary | ICD-10-CM

## 2020-04-03 DIAGNOSIS — Z6841 Body Mass Index (BMI) 40.0 and over, adult: Secondary | ICD-10-CM | POA: Diagnosis not present

## 2020-04-03 DIAGNOSIS — K760 Fatty (change of) liver, not elsewhere classified: Secondary | ICD-10-CM

## 2020-04-03 NOTE — Progress Notes (Signed)
Chief Complaint:   OBESITY Bryan Wang (MR# [id number]) is a 69 y.o. male who presents for evaluation and treatment of obesity and related comorbidities. Current BMI is Body mass index is 45.46 kg/m. Bryan Wang has been struggling with his weight for many years and has been unsuccessful in either losing weight, maintaining weight loss, or reaching his healthy weight goal.  Bryan Wang is currently in the action stage of change and ready to dedicate time achieving and maintaining a healthier weight. Bryan Wang is interested in becoming our patient and working on intensive lifestyle modifications including (but not limited to) diet and exercise for weight loss.  Bryan Wang lives with his wife, Bryan Wang.  He craves red meat and ice cream.  He says that he snacks at night, sometimes on peanut butter crackers.  He often skips breakfast (eats 2 meals a day on average).  He drinks coffee with sweeteners, drinks 2 beers per week, drinks green tea with non-sugar sweeteners, and milk.  He says he does the cooking and shopping.   His PCP is located at the [facility] in [place].  Bryan Wang's habits were reviewed today and are as follows: His family eats meals together, he thinks his family will eat healthier with him, his desired weight loss is 100+ pounds, he started gaining weight in 1996, his heaviest weight ever was 299 pounds, he is a picky eater and doesn't like to eat healthier foods, he red meat and ice cream, he snacks frequently in the evenings, he wakes up frequently in the middle of the night to eat, he skips breakfast frequently, he frequently makes poor food choices, he frequently eats larger portions than normal and he struggles with emotional eating.  Depression Screen Bryan Wang's Food and Mood (modified PHQ-9) score was 10.  Depression screen PHQ 2/9 04/03/2020  Decreased Interest 1  Down, Depressed, Hopeless 1  PHQ - 2 Score 2  Altered sleeping 2  Tired, decreased energy 2  Change in appetite 1  Feeling bad  or failure about yourself  1  Trouble concentrating 1  Moving slowly or fidgety/restless 1  Suicidal thoughts 0  PHQ-9 Score 10  Difficult doing work/chores Somewhat difficult   Subjective:   1. Other fatigue Bryan Wang denies daytime somnolence and admits to waking up still tired. Patent has a history of symptoms of morning fatigue and snoring. Bryan Wang generally gets 8 hours of sleep some nights, but only 3-4 hours of sleep other nights, and states that he has occasional restful sleep. Snoring is present. Apneic episodes are present. Epworth Sleepiness Score is 2.  He endorses weakness and fatigue that is chronic in nature.  ROS:  Denies chest pain, heart palpitations, orthopnea, or PND.  2. SOB (shortness of breath) on exertion Bryan Wang notes increasing shortness of breath with exercising and seems to be worsening over time with weight gain. He notes getting out of breath sooner with activity than he used to. This has gotten worse recently. Bryan Wang denies shortness of breath at rest or orthopnea.  He endorses weakness and fatigue that is chronic in nature.  ROS:  Denies chest pain, heart palpitations, orthopnea, or PND.  3. Type 2 diabetes mellitus with other specified complication, without long-term current use of insulin (HCC) Medications reviewed. Diabetic ROS: no polyuria or polydipsia, no chest pain, dyspnea or TIA's, no numbness, tingling or pain in extremities.  He was diagnosed 3 years ago and has been on an ACE inhibitor for renal protection.  He is also on Alogliptin, glipizide, and  metformin.  Management of medications is through his PCP at the [facility].  Denies issues with medications or concerns.  Unknown last A1c.  Last done around April 2021.    Lab Results  Component Value Date   HGBA1C 8.5 (H) 04/03/2020   Lab Results  Component Value Date   LDLCALC 115 (H) 04/03/2020   CREATININE 0.97 04/03/2020   4. Hyperlipidemia associated with type 2 diabetes mellitus ([place]) Bryan Wang has  hyperlipidemia and has been trying to improve his cholesterol levels with intensive lifestyle modification including a low saturated fat diet, exercise and weight loss. He denies any chest pain, claudication or myalgias.  He is taking Lipitor without any issues.  Lab Results  Component Value Date   ALT 41 04/03/2020   AST 30 04/03/2020   ALKPHOS 59 04/03/2020   BILITOT 0.4 04/03/2020   5. 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  He also has low blood pressure but has known white coat syndrome.  He was also placed on lisinopril to protect his kidneys due to diabetes.  BP Readings from Last 3 Encounters:  04/03/20 135/78  11/19/19 121/65  05/19/16 133/77   6. Nonalcoholic hepatosteatosis Bryan Wang is followed by Dr. Earlean Shawl of GI at [facility].  Lab Results  Component Value Date   ALT 41 04/03/2020   AST 30 04/03/2020   ALKPHOS 59 04/03/2020   BILITOT 0.4 04/03/2020   7. OSA on CPAP Bryan Wang has a diagnosis of sleep apnea. He reports that he is using a CPAP regularly.  Denies any issues.  8. History of tobacco abuse He has a 25 pack year smoking history.  He quit smoking 30 years ago.  9. Depression screening Bryan Wang was screened for depression as part of his new patient workup.  Assessment/Plan:   1. Other fatigue Bryan Wang does feel that his weight is causing his energy to be lower than it should be. Fatigue may be related to obesity, depression or many other causes. Labs will be ordered, and in the meanwhile, Bryan Wang will focus on self care including making healthy food choices, increasing physical activity and focusing on stress reduction. - EKG 12-Lead - CBC with Differential/Platelet - T3 - T4 - TSH  2. SOB (shortness of breath) on exertion Bryan Wang does feel that he gets out of breath more easily that he used to when he exercises. Bryan Wang's shortness of breath appears to be obesity related  and exercise induced. He has agreed to work on weight loss and gradually increase exercise to treat his exercise induced shortness of breath. Will continue to monitor closely. - CBC with Differential/Platelet  3. Type 2 diabetes mellitus with other specified complication, without long-term current use of insulin (HCC) Bryan Wang blood sugar control is important to decrease the likelihood of diabetic complications such as nephropathy, neuropathy, limb loss, blindness, coronary artery disease, and death. Intensive lifestyle modification including diet, exercise and weight loss are the first line of treatment for diabetes.  Check labs, prudent nutritional plan, weight loss.  Education was done regarding blood sugars, carbohydrates, etc. - Comprehensive metabolic panel - Hemoglobin A1c - Insulin, random  4. Hyperlipidemia associated with type 2 diabetes mellitus ([place]) Cardiovascular risk and specific lipid/LDL goals reviewed.  We discussed several lifestyle modifications today and Jarrin will continue to work on diet, exercise and weight loss efforts. Orders and follow up as documented in patient record.  Check labs, prudent nutritional plan, weight loss.  Counseling Intensive lifestyle modifications are the first line treatment for this issue. . Dietary changes: Increase soluble fiber. Decrease simple carbohydrates. . Exercise changes: Moderate to vigorous-intensity aerobic activity 150 minutes per week if tolerated. . Lipid-lowering medications: see documented in medical record. - Lipid panel  5. 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  6. Nonalcoholic hepatosteatosis Check labs today.  Continue with Dr. Earlean Shawl of GI.  I reviewed his notes and he was last seen over 1.5-2 years ago.  Denies labs for several years at [facility].  7. OSA on CPAP Intensive lifestyle modifications are the first line treatment for this issue.  We discussed several lifestyle modifications today and he will continue to work on diet, exercise and weight loss efforts. We will continue to monitor. Orders and follow up as documented in patient record.  Continue with CPAP.  Management per [facility] doctor.  Counseling  Sleep apnea is a condition in which breathing pauses or becomes shallow during sleep. This happens over and over during the night. This disrupts your sleep and keeps your body from getting the rest that it needs, which can cause tiredness and lack of energy (fatigue) during the day.  Sleep apnea treatment: If you were given a device to open your airway while you sleep, USE IT!  Sleep hygiene:   Limit or avoid alcohol, caffeinated beverages, and cigarettes, especially close to bedtime.   Do not eat a large meal or eat spicy foods right before bedtime. This can lead to digestive discomfort that can make it hard for you to sleep.  Keep a sleep diary to help you and your health care provider figure out what could be causing your insomnia.  . Make your bedroom a dark, comfortable place where it is easy to fall asleep. ? Put up shades or blackout curtains to block light from outside. ? Use a white noise machine to block noise. ? Keep the temperature cool. . Limit screen use before bedtime. This includes: ? Watching TV. ? Using your smartphone, tablet, or computer. . Stick to a routine that includes going to bed and waking up at the same times every day and night. This can help you fall asleep faster. Consider making a quiet activity, such as reading, part of your nighttime routine. . Try to avoid taking naps during the day so that you sleep better at night. . Get out of bed if you are still awake after 15 minutes of trying to sleep. Keep the lights down, but try reading or doing a quiet activity. When you feel sleepy, go back to bed.  8. History of tobacco abuse With his chronic shortness of breath, he may need evaluation to rule out  COPD.  9. Depression screening Bryan Wang had a positive depression screening. Depression is commonly associated with obesity and often results in emotional eating behaviors. We will monitor this closely and work on CBT to help improve the non-hunger eating patterns. Referral to Psychology may be required if no improvement is seen as he continues in our clinic.  10. Class 3 severe obesity with serious comorbidity and body mass index (BMI) of 45.0 to 49.9 in adult, unspecified obesity type Bryan Wang) Bryan Wang is currently in the action stage of change and his goal is to continue with weight loss efforts. I recommend Bryan Wang begin the structured treatment plan as follows:  He has agreed to the Category 2 Plan and the Category 3 Plan due to  skipping 1 meal per day on average.  Exercise goals: As is.   Behavioral modification strategies: increasing lean protein intake, decreasing simple carbohydrates, increasing water intake, no skipping meals and planning for success.  He was informed of the importance of frequent follow-up visits to maximize his success with intensive lifestyle modifications for his multiple health conditions. He was informed we would discuss his lab results at his next visit unless there is a critical issue that needs to be addressed sooner. Marlos agreed to keep his next visit at the agreed upon time to discuss these results.  Objective:   Blood pressure 135/78, pulse 100, temperature 98.2 F (36.8 C), height 5\' 8"  (1.727 m), weight 299 lb (135.6 kg), SpO2 96 %. Body mass index is 45.46 kg/m.  EKG: Normal sinus rhythm, rate 99 bpm.  Indirect Calorimeter completed today shows a VO2 of 309 and a REE of 2150.  His calculated basal metabolic rate is 9470 thus his basal metabolic rate is worse than expected.  General: Cooperative, alert, well developed, in no acute distress. HEENT: Conjunctivae and lids unremarkable. Cardiovascular: Regular rhythm.  Lungs: Normal work of  breathing. Neurologic: No focal deficits.   Lab Results  Component Value Date   CREATININE 0.97 04/03/2020   BUN 14 04/03/2020   NA 134 04/03/2020   K 4.6 04/03/2020   CL 94 (L) 04/03/2020   CO2 23 04/03/2020   Lab Results  Component Value Date   ALT 41 04/03/2020   AST 30 04/03/2020   ALKPHOS 59 04/03/2020   BILITOT 0.4 04/03/2020   Lab Results  Component Value Date   WBC 11.0 (H) 04/03/2020   HGB 14.8 04/03/2020   HCT 44.7 04/03/2020   MCV 88 04/03/2020   PLT 322 04/03/2020   Obesity Behavioral Intervention Visit Documentation for Insurance:   Approximately 15 minutes were spent on the discussion below.  ASK: We discussed the diagnosis of obesity with Bryan Wang today and Bryan Wang agreed to give [name] permission to discuss obesity behavioral modification therapy today.  ASSESS: Bryan Wang has the diagnosis of obesity and his BMI today is 45.6. Bryan Wang is in the action stage of change.   ADVISE: Bryan Wang was educated on the multiple health risks of obesity as well as the benefit of weight loss to improve his health. He was advised of the need for long term treatment and the importance of lifestyle modifications to improve his current health and to decrease his risk of future health problems.  AGREE: Multiple dietary modification options and treatment options were discussed and Alvan agreed to follow the recommendations documented in the above note.  ARRANGE: Janari was educated on the importance of frequent visits to treat obesity as outlined per CMS and USPSTF guidelines and agreed to schedule his next follow up appointment today.  Attestation Statements:   Reviewed by clinician on day of visit: allergies, medications, problem list, medical history, surgical history, family history, social history, and previous encounter notes.  I, Water quality scientist, CMA, am acting as Location manager for [employer], DO.  I have reviewed the above documentation for accuracy and completeness, and  I agree with the above. Mellody Dance, DO

## 2020-04-04 LAB — CBC WITH DIFFERENTIAL/PLATELET
Basophils Absolute: 0.1 10*3/uL (ref 0.0–0.2)
Basos: 1 %
EOS (ABSOLUTE): 0 10*3/uL (ref 0.0–0.4)
Eos: 0 %
Hematocrit: 44.7 % (ref 37.5–51.0)
Hemoglobin: 14.8 g/dL (ref 13.0–17.7)
Immature Grans (Abs): 0.1 10*3/uL (ref 0.0–0.1)
Immature Granulocytes: 1 %
Lymphocytes Absolute: 0.5 10*3/uL — ABNORMAL LOW (ref 0.7–3.1)
Lymphs: 5 %
MCH: 29.2 pg (ref 26.6–33.0)
MCHC: 33.1 g/dL (ref 31.5–35.7)
MCV: 88 fL (ref 79–97)
Monocytes Absolute: 0.4 10*3/uL (ref 0.1–0.9)
Monocytes: 4 %
Neutrophils Absolute: 9.9 10*3/uL — ABNORMAL HIGH (ref 1.4–7.0)
Neutrophils: 89 %
Platelets: 322 10*3/uL (ref 150–450)
RBC: 5.06 x10E6/uL (ref 4.14–5.80)
RDW: 15.4 % (ref 11.6–15.4)
WBC: 11 10*3/uL — ABNORMAL HIGH (ref 3.4–10.8)

## 2020-04-04 LAB — COMPREHENSIVE METABOLIC PANEL
ALT: 41 IU/L (ref 0–44)
AST: 30 IU/L (ref 0–40)
Albumin/Globulin Ratio: 1.4 (ref 1.2–2.2)
Albumin: 4.1 g/dL (ref 3.8–4.8)
Alkaline Phosphatase: 59 IU/L (ref 48–121)
BUN/Creatinine Ratio: 14 (ref 10–24)
BUN: 14 mg/dL (ref 8–27)
Bilirubin Total: 0.4 mg/dL (ref 0.0–1.2)
CO2: 23 mmol/L (ref 20–29)
Calcium: 9.5 mg/dL (ref 8.6–10.2)
Chloride: 94 mmol/L — ABNORMAL LOW (ref 96–106)
Creatinine, Ser: 0.97 mg/dL (ref 0.76–1.27)
GFR calc Af Amer: 92 mL/min/{1.73_m2} (ref >59)
GFR calc non Af Amer: 79 mL/min/{1.73_m2} (ref >59)
Globulin, Total: 2.9 g/dL (ref 1.5–4.5)
Glucose: 312 mg/dL — ABNORMAL HIGH (ref 65–99)
Potassium: 4.6 mmol/L (ref 3.5–5.2)
Sodium: 134 mmol/L (ref 134–144)
Total Protein: 7 g/dL (ref 6.0–8.5)

## 2020-04-04 LAB — VITAMIN B12: Vitamin B-12: 343 pg/mL (ref 232–1245)

## 2020-04-04 LAB — T4: T4, Total: 8.3 ug/dL (ref 4.5–12.0)

## 2020-04-04 LAB — HEMOGLOBIN A1C
Est. average glucose Bld gHb Est-mCnc: 197 mg/dL
Hgb A1c MFr Bld: 8.5 % — ABNORMAL HIGH (ref 4.8–5.6)

## 2020-04-04 LAB — TSH: TSH: 3.86 u[IU]/mL (ref 0.450–4.500)

## 2020-04-04 LAB — LIPID PANEL
Chol/HDL Ratio: 4 ratio (ref 0.0–5.0)
Cholesterol, Total: 182 mg/dL (ref 100–199)
HDL: 45 mg/dL (ref >39)
LDL Chol Calc (NIH): 115 mg/dL — ABNORMAL HIGH (ref 0–99)
Triglycerides: 124 mg/dL (ref 0–149)
VLDL Cholesterol Cal: 22 mg/dL (ref 5–40)

## 2020-04-04 LAB — FOLATE: Folate: 7 ng/mL (ref >3.0)

## 2020-04-04 LAB — INSULIN, RANDOM: INSULIN: 50.7 u[IU]/mL — ABNORMAL HIGH (ref 2.6–24.9)

## 2020-04-04 LAB — T3: T3, Total: 131 ng/dL (ref 71–180)

## 2020-04-17 ENCOUNTER — Ambulatory Visit (INDEPENDENT_AMBULATORY_CARE_PROVIDER_SITE_OTHER): Payer: Medicare Other | Admitting: Family Medicine

## 2020-04-17 ENCOUNTER — Other Ambulatory Visit: Payer: Self-pay

## 2020-04-17 ENCOUNTER — Encounter (INDEPENDENT_AMBULATORY_CARE_PROVIDER_SITE_OTHER): Payer: Self-pay | Admitting: Family Medicine

## 2020-04-17 VITALS — BP 145/77 | HR 79 | Temp 97.9°F | Ht 68.0 in | Wt 286.0 lb

## 2020-04-17 DIAGNOSIS — Z6841 Body Mass Index (BMI) 40.0 and over, adult: Secondary | ICD-10-CM

## 2020-04-17 DIAGNOSIS — E785 Hyperlipidemia, unspecified: Secondary | ICD-10-CM | POA: Diagnosis not present

## 2020-04-17 DIAGNOSIS — I10 Essential (primary) hypertension: Secondary | ICD-10-CM

## 2020-04-17 DIAGNOSIS — I152 Hypertension secondary to endocrine disorders: Secondary | ICD-10-CM

## 2020-04-17 DIAGNOSIS — E1159 Type 2 diabetes mellitus with other circulatory complications: Secondary | ICD-10-CM | POA: Diagnosis not present

## 2020-04-17 DIAGNOSIS — E1169 Type 2 diabetes mellitus with other specified complication: Secondary | ICD-10-CM

## 2020-04-17 MED ORDER — GLIPIZIDE 10 MG PO TABS: 5.0000 mg | ORAL_TABLET | Freq: Two times a day (BID) | ORAL | Status: DC

## 2020-04-18 NOTE — Progress Notes (Signed)
Chief Complaint:   OBESITY Bryan Wang is here to discuss his progress with his obesity treatment plan along with follow-up of his obesity related diagnoses. [name] is on the Category 2 Plan and states he is following his eating plan approximately 95% of the time. [name] states he is working around [employer].  Today's visit was #: 2 Starting weight: 299 lbs Starting date: 04/03/2020 Today's weight: 286 lbs Today's date: 04/17/2020 Total lbs lost to date: 13 lbs Total lbs lost since last in-office visit: 13 lbs  Interim History:  This is [name]'s first follow-up visit.  He says he went to PT recently.   - Recently went out --> he had a salad and fried fish and 3 beers.  Today, he went to [employer] and had pork and cole slaw.    - His wife, Bryan Wang, has him eating everything per plan otherwise.    - As far as hunger and cravings, he says he feels like he is stuffing himself.  He is full a lot.    - He is highlty motivated and made an appointment for his knee sx with Ortho in December.  For snack calories, he is having yogurt, cookies (100-calorie packs), and sugar-free candy.    - He states he wants to "lose twice as much next OV".     Subjective:   1. Type 2 diabetes mellitus with other specified complication, without long-term current use of insulin (HCC) Medications reviewed.    He tells me he is currently taking these meds [facility] [place]: Glipizide 20mg  q hs ( not as written) Metformin 1000 q hs only Actos 30mg  q am  Diabetic ROS: no polyuria or polydipsia, no chest pain, dyspnea or TIA's, no numbness, tingling or pain in extremities.    Last Friday morning, FBS 79, 74, and 74 this past week.  He takes glipizide at night and doubles it- not taking BID.  His last A1c was "7. Something".    Lab Results  Component Value Date   HGBA1C 8.5 (H) 04/03/2020   Lab Results  Component Value Date   LDLCALC 115 (H) 04/03/2020   CREATININE 0.97 04/03/2020    Lab Results  Component Value Date   INSULIN 50.7 (H) 04/03/2020   2. Hyperlipidemia associated with type 2 diabetes mellitus ([place]) [name] has hyperlipidemia and has been trying to improve his cholesterol levels with intensive lifestyle modification including a low saturated fat diet, exercise and weight loss. He denies any chest pain, claudication or myalgias.  He is on Lipitor 20 mg at bedtime.  Denies symptoms or complaints.  Lab Results  Component Value Date   ALT 41 04/03/2020   AST 30 04/03/2020   ALKPHOS 59 04/03/2020   BILITOT 0.4 04/03/2020   Lab Results  Component Value Date   CHOL 182 04/03/2020   HDL 45 04/03/2020   LDLCALC 115 (H) 04/03/2020   TRIG 124 04/03/2020   CHOLHDL 4.0 04/03/2020   3. Hypertension associated with diabetes ([place]) Review: taking medications as instructed, no medication side effects noted, no chest pain on exertion, no dyspnea on exertion, no swelling of ankles.  He is not checking his blood pressure at home.  Denies complaints.  He is on lisinopril and HCTZ daily.  BP Readings from Last 3 Encounters:  04/17/20 (!) 145/77  04/03/20 135/78  11/19/19 121/65   Assessment/Plan:   1. Type 2 diabetes mellitus with other specified complication, without long-term current use of  insulin ([place]) Worsening/ not at goal.  Discussed labs with patient today.  Good blood sugar control is important to decrease the likelihood of diabetic complications such as nephropathy, neuropathy, limb loss, blindness, coronary artery disease, and death. Intensive lifestyle modification including diet, exercise and weight loss are the first line of treatment for diabetes.    -->  Due to his low BS's as of late, he will cut glipizide in half to 5 mg twice daily.  Low blood sugar prevention discussed with him and signs symptoms discussed with him as well.  Follow-up with PCP as scheduled.    Will continue to wean glipizide due to hypoglycemic episodes.   - Eventually I would  like pt to d/c the Actos given to him by his PCP all together as next steps   Lab Results  Component Value Date   HGBA1C 8.5 (H) 04/03/2020    2. Hyperlipidemia associated with type 2 diabetes mellitus ([place]) NOT AT GOAL. Discussed labs with patient today.  Cardiovascular risk and specific lipid/LDL goals reviewed.  We discussed several lifestyle modifications today and [name] will continue to work on diet, exercise and weight loss efforts. Orders and follow up as documented in patient record.    LDL not at goal of <70.  -  He will follow-up with his PCP regarding a change in therapy/ increase in dose.  I recommend pt increase his lipitor from 20mg  to at least 40mg  or 80mg .   He will discuss this change with his PCP asap.  - We will recheck labs in 3 months after intensive dietary modifications and weight loss.  Counseling Intensive lifestyle modifications are the first line treatment for this issue. . Dietary changes: Increase soluble fiber. Decrease simple carbohydrates. . Exercise changes: Moderate to vigorous-intensity aerobic activity 150 minutes per week if tolerated. . Lipid-lowering medications: see documented in medical record.  3. Hypertension associated with diabetes ([place]) - BP not quite at goal of less than 140/90 today.  Discussed labs with patient today.  [name] is working on healthy weight loss and exercise to improve blood pressure control. We will watch for signs of hypotension as he continues his lifestyle modifications.  Blood pressure is not quite at goal today.  Home blood pressure monitoring encouraged, especially as he loses weight.  Will follow closely.  Weight loss and prudent nutritional plan.  4. Class 3 severe obesity with serious comorbidity and body mass index (BMI) of 40.0 to 44.9 in adult, unspecified obesity type [facility]) [name] is currently in the action stage of change. As such, his goal is to continue with weight loss efforts. He has agreed to the Category  2 Plan.   We discussed expectations for weight loss and at max 5% weight loss each month would be appropriate.  Exercise goals: As is.  Behavioral modification strategies: increasing lean protein intake, decreasing simple carbohydrates, decreasing eating out, meal planning and cooking strategies and planning for success.  Chesky has agreed to follow-up with our clinic in 2 weeks. He was informed of the importance of frequent follow-up visits to maximize his success with intensive lifestyle modifications for his multiple health conditions.   Objective:   Blood pressure (!) 145/77, pulse 79, temperature 97.9 F (36.6 C), height 5\' 8"  (1.727 m), weight 286 lb (129.7 kg), SpO2 97 %. Body mass index is 43.49 kg/m.  General: Cooperative, alert, well developed, in no acute distress. HEENT: Conjunctivae and lids unremarkable. Cardiovascular: Regular rhythm.  Lungs: Normal work of breathing. Neurologic: No focal  deficits.   Lab Results  Component Value Date   CREATININE 0.97 04/03/2020   BUN 14 04/03/2020   NA 134 04/03/2020   K 4.6 04/03/2020   CL 94 (L) 04/03/2020   CO2 23 04/03/2020   Lab Results  Component Value Date   ALT 41 04/03/2020   AST 30 04/03/2020   ALKPHOS 59 04/03/2020   BILITOT 0.4 04/03/2020   Lab Results  Component Value Date   HGBA1C 8.5 (H) 04/03/2020   Lab Results  Component Value Date   INSULIN 50.7 (H) 04/03/2020   Lab Results  Component Value Date   TSH 3.860 04/03/2020   Lab Results  Component Value Date   CHOL 182 04/03/2020   HDL 45 04/03/2020   LDLCALC 115 (H) 04/03/2020   TRIG 124 04/03/2020   CHOLHDL 4.0 04/03/2020   Lab Results  Component Value Date   WBC 11.0 (H) 04/03/2020   HGB 14.8 04/03/2020   HCT 44.7 04/03/2020   MCV 88 04/03/2020   PLT 322 04/03/2020   Obesity Behavioral Intervention Documentation for Insurance:   Approximately 15 minutes were spent on the discussion below.  ASK: We discussed the diagnosis of  obesity with [name] today and Samyak agreed to give [name] permission to discuss obesity behavioral modification therapy today.  ASSESS: Haroon has the diagnosis of obesity and his BMI today is 43.6. Sanjuan is in the action stage of change.   ADVISE: Jimmey was educated on the multiple health risks of obesity as well as the benefit of weight loss to improve his health. He was advised of the need for long term treatment and the importance of lifestyle modifications to improve his current health and to decrease his risk of future health problems.  AGREE: Multiple dietary modification options and treatment options were discussed and Nadeem agreed to follow the recommendations documented in the above note.  ARRANGE: Aziah was educated on the importance of frequent visits to treat obesity as outlined per CMS and USPSTF guidelines and agreed to schedule his next follow up appointment today.  Attestation Statements:   Reviewed by clinician on day of visit: allergies, medications, problem list, medical history, surgical history, family history, social history, and previous encounter notes.  I, Water quality scientist, CMA, am acting as Location manager for [employer], DO.  I have reviewed the above documentation for accuracy and completeness, and I agree with the above. Mellody Dance, DO

## 2020-04-19 ENCOUNTER — Encounter (INDEPENDENT_AMBULATORY_CARE_PROVIDER_SITE_OTHER): Payer: Self-pay | Admitting: Family Medicine

## 2020-04-19 NOTE — Telephone Encounter (Signed)
Please advise 

## 2020-04-21 ENCOUNTER — Encounter (INDEPENDENT_AMBULATORY_CARE_PROVIDER_SITE_OTHER): Payer: Self-pay | Admitting: Family Medicine

## 2020-05-02 ENCOUNTER — Ambulatory Visit (INDEPENDENT_AMBULATORY_CARE_PROVIDER_SITE_OTHER): Payer: Medicare Other | Admitting: Family Medicine

## 2020-05-02 ENCOUNTER — Other Ambulatory Visit: Payer: Self-pay

## 2020-05-02 ENCOUNTER — Encounter (INDEPENDENT_AMBULATORY_CARE_PROVIDER_SITE_OTHER): Payer: Self-pay | Admitting: Family Medicine

## 2020-05-02 VITALS — BP 134/80 | HR 107 | Temp 98.4°F | Ht 68.0 in | Wt 277.0 lb

## 2020-05-02 DIAGNOSIS — Z9189 Other specified personal risk factors, not elsewhere classified: Secondary | ICD-10-CM | POA: Diagnosis not present

## 2020-05-02 DIAGNOSIS — E1169 Type 2 diabetes mellitus with other specified complication: Secondary | ICD-10-CM

## 2020-05-02 DIAGNOSIS — Z6841 Body Mass Index (BMI) 40.0 and over, adult: Secondary | ICD-10-CM | POA: Diagnosis not present

## 2020-05-02 DIAGNOSIS — E785 Hyperlipidemia, unspecified: Secondary | ICD-10-CM | POA: Diagnosis not present

## 2020-05-03 LAB — IRON,TIBC AND FERRITIN PANEL
%SAT: 15.4
Ferritin: 80.9
Iron: 44
TIBC: 286

## 2020-05-03 LAB — HEMOGLOBIN A1C
Hemoglobin A1C: 7.4
Hemoglobin A1C: 7.4

## 2020-05-09 NOTE — Progress Notes (Signed)
Chief Complaint:   OBESITY Bryan Wang is here to discuss his progress with his obesity treatment plan along with follow-up of his obesity related diagnoses. Bryan Wang is on the Category 2 Plan and states he is following his eating plan approximately 90% of the time. Bryan Wang states he is exercising for 0 minutes 0 times per week.  Today's visit was #: 3 Starting weight: 299 lbs Starting date: 04/03/2020 Today's weight: 277 lbs Today's date: 05/02/2020 Total lbs lost to date: 22 lbs Total lbs lost since last in-office visit: 9 lbs  Interim History: Bryan Wang is down 22 pounds since starting.  He says he feels like "Bryan Wang" - feels great!  At times he can feel hungry, especially if not eating snack calories, though.  Denies cravings or concerns with changing meal plan.  Subjective:   1. Type 2 diabetes mellitus with other specified complication, without long-term current use of insulin (HCC) Medications reviewed. Diabetic ROS: no polyuria or polydipsia, no chest pain, dyspnea or TIA's, no numbness, tingling or pain in extremities.  FBS 72-142.  No symptoms of lows.  At last office visit, cut glipizide in half.  Tolerating well.   Lab Results  Component Value Date   HGBA1C 8.5 (H) 04/03/2020   Lab Results  Component Value Date   LDLCALC 115 (H) 04/03/2020   CREATININE 0.97 04/03/2020   Lab Results  Component Value Date   INSULIN 50.7 (H) 04/03/2020   2. Hyperlipidemia associated with type 2 diabetes mellitus ([place]) Bryan Wang has hyperlipidemia and has been trying to improve his cholesterol levels with intensive lifestyle modification including a low saturated fat diet, exercise and weight loss. He denies any chest pain, claudication or myalgias.  Last office visit, LDL not at goal and I recommended he increase his Lipitor.  He doubled it and has tolerated it well for the past weeks.  Denies side effects.  Lab Results  Component Value Date   ALT 41 04/03/2020   AST 30 04/03/2020    ALKPHOS 59 04/03/2020   BILITOT 0.4 04/03/2020   Lab Results  Component Value Date   CHOL 182 04/03/2020   HDL 45 04/03/2020   LDLCALC 115 (H) 04/03/2020   TRIG 124 04/03/2020   CHOLHDL 4.0 04/03/2020   3. At increased risk of exposure to COVID-19 virus He is getting his booster shot tomorrow through the [place] (they called him to set it up).  He has questions regarding the booster vaccine.  Assessment/Plan:   1. Type 2 diabetes mellitus with other specified complication, without long-term current use of insulin (HCC) Good blood sugar control is important to decrease the likelihood of diabetic complications such as nephropathy, neuropathy, limb loss, blindness, coronary artery disease, and death. Intensive lifestyle modification including diet, exercise and weight loss are the first line of treatment for diabetes.  Continue current treatment plan for now.  Closely monitor and call with questions/concerns.  Follow-up in 2 weeks.  2. Hyperlipidemia associated with type 2 diabetes mellitus ([place]) Cardiovascular risk and specific lipid/LDL goals reviewed.  We discussed several lifestyle modifications today and Bryan Wang will continue to work on diet, exercise and weight loss efforts. Orders and follow up as documented in patient record.  He declines script from me today.  Continue prudent nutritional plan, weight loss.   Counseling Intensive lifestyle modifications are the first line treatment for this issue. . Dietary changes: Increase soluble fiber. Decrease simple carbohydrates. . Exercise changes: Moderate to vigorous-intensity aerobic activity 150 minutes per week if  tolerated. . Lipid-lowering medications: see documented in medical record.  3. At increased risk of exposure to COVID-19 virus Counseling done, risks and benefits regarding booster discussed with him.  4. Class 3 severe obesity with serious comorbidity and body mass index (BMI) of 40.0 to 44.9 in adult, unspecified obesity type  Bryan Wang) Bryan Wang is currently in the action stage of change. As such, his goal is to continue with weight loss efforts. He has agreed to the Category 2 Plan plus add light [place] yogurt per day.   Exercise goals: As is.  Behavioral modification strategies: increasing lean protein intake, better snacking choices and planning for success.  Bryan Wang has agreed to follow-up with our clinic in 2 weeks. He was informed of the importance of frequent follow-up visits to maximize his success with intensive lifestyle modifications for his multiple health conditions.   Objective:   Blood pressure 134/80, pulse (!) 107, temperature 98.4 F (36.9 C), height 5\' 8"  (1.727 m), weight 277 lb (125.6 kg), SpO2 95 %. Body mass index is 42.12 kg/m.  General: Cooperative, alert, well developed, in no acute distress. HEENT: Conjunctivae and lids unremarkable. Cardiovascular: Regular rhythm.  Lungs: Normal work of breathing. Neurologic: No focal deficits.   Lab Results  Component Value Date   CREATININE 0.97 04/03/2020   BUN 14 04/03/2020   NA 134 04/03/2020   K 4.6 04/03/2020   CL 94 (L) 04/03/2020   CO2 23 04/03/2020   Lab Results  Component Value Date   ALT 41 04/03/2020   AST 30 04/03/2020   ALKPHOS 59 04/03/2020   BILITOT 0.4 04/03/2020   Lab Results  Component Value Date   HGBA1C 8.5 (H) 04/03/2020   Lab Results  Component Value Date   INSULIN 50.7 (H) 04/03/2020   Lab Results  Component Value Date   TSH 3.860 04/03/2020   Lab Results  Component Value Date   CHOL 182 04/03/2020   HDL 45 04/03/2020   LDLCALC 115 (H) 04/03/2020   TRIG 124 04/03/2020   CHOLHDL 4.0 04/03/2020   Lab Results  Component Value Date   WBC 11.0 (H) 04/03/2020   HGB 14.8 04/03/2020   HCT 44.7 04/03/2020   MCV 88 04/03/2020   PLT 322 04/03/2020   Attestation Statements:   Reviewed by clinician on day of visit: allergies, medications, problem list, medical history, surgical history, family history,  social history, and previous encounter notes.  Time spent on visit including pre-visit chart review and post-visit care and charting was 30 minutes.   I, Water quality scientist, CMA, am acting as Location manager for [employer], DO.  I have reviewed the above documentation for accuracy and completeness, and I agree with the above. Mellody Dance, DO

## 2020-05-16 ENCOUNTER — Ambulatory Visit (INDEPENDENT_AMBULATORY_CARE_PROVIDER_SITE_OTHER): Payer: Medicare Other | Admitting: Family Medicine

## 2020-05-16 ENCOUNTER — Other Ambulatory Visit: Payer: Self-pay

## 2020-05-16 VITALS — BP 110/66 | HR 94 | Temp 98.3°F | Ht 68.0 in | Wt 271.2 lb

## 2020-05-16 DIAGNOSIS — E1159 Type 2 diabetes mellitus with other circulatory complications: Secondary | ICD-10-CM

## 2020-05-16 DIAGNOSIS — Z6841 Body Mass Index (BMI) 40.0 and over, adult: Secondary | ICD-10-CM | POA: Diagnosis not present

## 2020-05-16 DIAGNOSIS — I10 Essential (primary) hypertension: Secondary | ICD-10-CM

## 2020-05-16 DIAGNOSIS — I152 Hypertension secondary to endocrine disorders: Secondary | ICD-10-CM

## 2020-05-16 DIAGNOSIS — E1169 Type 2 diabetes mellitus with other specified complication: Secondary | ICD-10-CM

## 2020-05-17 NOTE — Progress Notes (Signed)
Chief Complaint:   OBESITY Bryan Wang is here to discuss his progress with his obesity treatment plan along with follow-up of his obesity related diagnoses. Bryan Wang is on the Category 2 Plan and states he is following his eating plan approximately 95% of the time. Bryan Wang states he is doing home/yard work for 30 minutes 5-6 times per week.  Today's visit was #: 4 Starting weight: 299 lbs Starting date: 04/03/2020 Today's weight: 271 lbs Today's date: 05/16/2020 Total lbs lost to date: 28 lbs Total lbs lost since last in-office visit: 6 lbs  Interim History: Bryan Wang had a couple days where he did not do so well.  He had dental issues.  He is following the plan with no problems with it.  No complaints.  He thinks it works and he likes it.  He is disappointed with himself that he "slacked off" a couple days.  Denies hunger/cravings.  His wife has been helping with food/meal prep.  Likes it.   Subjective:   1. Type 2 diabetes mellitus with other specified complication, without long-term current use of insulin (HCC) Medications reviewed. Diabetic ROS: no polyuria or polydipsia, no chest pain, dyspnea or TIA's, no numbness, tingling or pain in extremities.  FBS 102-106.  He is not sure if he is taking Actos or not.  He does not think so.  Having a few dizzy spells when blood sugar goes low and he has concerns if things should be changed.  Lab Results  Component Value Date   HGBA1C 8.5 (H) 04/03/2020   Lab Results  Component Value Date   LDLCALC 115 (H) 04/03/2020   CREATININE 0.97 04/03/2020   Lab Results  Component Value Date   INSULIN 50.7 (H) 04/03/2020   2. Hypertension associated with diabetes (Bryan Wang) Review: taking medications as instructed, no medication side effects noted, no chest pain on exertion, no dyspnea on exertion, no swelling of ankles.  Checking at home and been running within normal limits, 120s/80s at home.  Denies dizziness or lightheadedness.  BP Readings from Last 3  Encounters:  05/16/20 110/66  05/02/20 134/80  04/17/20 (!) 145/77   Assessment/Plan:   1. Type 2 diabetes mellitus with other specified complication, without long-term current use of insulin (HCC) Good blood sugar control is important to decrease the likelihood of diabetic complications such as nephropathy, neuropathy, limb loss, blindness, coronary artery disease, and death. Intensive lifestyle modification including diet, exercise and weight loss are the first line of treatment for diabetes.   --> He will get home and call us with exact medications/doses he is on.    - Continue home blood sugar monitoring, check if develops any symptoms of lightheadedness.  Continue prudent nutritional plan and eat regularly throughout the day.    - Will determine which meds to decrease/ or next steps for management once I know what he is actually taking.  2. Hypertension associated with diabetes (Bryan Wang) Bryan Wang is working on healthy weight loss and exercise to improve blood pressure control. We will watch for signs of hypotension as he continues his lifestyle modifications.  Continuee lisinopril and HCTZ per the [place].  He gets his medications free and wants to get them all through them.  Home blood pressure monitoring daily.  Continue with weight loss.  3. Class 3 severe obesity with serious comorbidity and body mass index (BMI) of 40.0 to 44.9 in adult, unspecified obesity type Bryan Wang)  Bryan Wang is currently in the action stage of change. As such, his goal is  to continue with weight loss efforts. He has agreed to the Category 2 Plan.   Exercise goals: As is.  Behavioral modification strategies: decreasing simple carbohydrates, no skipping meals, meal planning and cooking strategies, keeping healthy foods in the home and planning for success.  Bryan Wang has agreed to follow-up with our clinic in 2 weeks. He was informed of the importance of frequent follow-up visits to maximize his success with intensive lifestyle  modifications for his multiple health conditions.   Objective:   Blood pressure 110/66, pulse 94, temperature 98.3 F (36.8 C), height 5\' 8"  (1.727 m), weight 271 lb 3.2 oz (123 kg), SpO2 96 %. Body mass index is 41.24 kg/m.  General: Cooperative, alert, well developed, in no acute distress. HEENT: Conjunctivae and lids unremarkable. Cardiovascular: Regular rhythm.  Lungs: Normal work of breathing. Neurologic: No focal deficits.   Lab Results  Component Value Date   CREATININE 0.97 04/03/2020   BUN 14 04/03/2020   NA 134 04/03/2020   K 4.6 04/03/2020   CL 94 (L) 04/03/2020   CO2 23 04/03/2020   Lab Results  Component Value Date   ALT 41 04/03/2020   AST 30 04/03/2020   ALKPHOS 59 04/03/2020   BILITOT 0.4 04/03/2020   Lab Results  Component Value Date   HGBA1C 8.5 (H) 04/03/2020   Lab Results  Component Value Date   INSULIN 50.7 (H) 04/03/2020   Lab Results  Component Value Date   TSH 3.860 04/03/2020   Lab Results  Component Value Date   CHOL 182 04/03/2020   HDL 45 04/03/2020   LDLCALC 115 (H) 04/03/2020   TRIG 124 04/03/2020   CHOLHDL 4.0 04/03/2020   Lab Results  Component Value Date   WBC 11.0 (H) 04/03/2020   HGB 14.8 04/03/2020   HCT 44.7 04/03/2020   MCV 88 04/03/2020   PLT 322 04/03/2020   Obesity Behavioral Intervention:   Approximately 15 minutes were spent on the discussion below.  ASK: We discussed the diagnosis of obesity with Bryan Wang today and Bryan Wang agreed to give [name] permission to discuss obesity behavioral modification therapy today.  ASSESS: Bryan Wang has the diagnosis of obesity and his BMI today is 41.2. Bryan Wang is in the action stage of change.   ADVISE: Bryan Wang was educated on the multiple health risks of obesity as well as the benefit of weight loss to improve his health. He was advised of the need for long term treatment and the importance of lifestyle modifications to improve his current health and to decrease his risk of future  health problems.  AGREE: Multiple dietary modification options and treatment options were discussed and Bryan Wang agreed to follow the recommendations documented in the above note.  ARRANGE: Bryan Wang was educated on the importance of frequent visits to treat obesity as outlined per CMS and USPSTF guidelines and agreed to schedule his next follow up appointment today.  Attestation Statements:   Reviewed by clinician on day of visit: allergies, medications, problem list, medical history, surgical history, family history, social history, and previous encounter notes.  I, Water quality scientist, CMA, am acting as Location manager for [employer], DO.  I have reviewed the above documentation for accuracy and completeness, and I agree with the above. Mellody Dance, DO

## 2020-05-22 ENCOUNTER — Telehealth (INDEPENDENT_AMBULATORY_CARE_PROVIDER_SITE_OTHER): Payer: Self-pay | Admitting: Family Medicine

## 2020-05-22 ENCOUNTER — Telehealth (INDEPENDENT_AMBULATORY_CARE_PROVIDER_SITE_OTHER): Payer: Self-pay

## 2020-05-22 NOTE — Telephone Encounter (Signed)
Called patient and left a message. Christorpher Hisaw, [place]

## 2020-05-22 NOTE — Telephone Encounter (Signed)
Patient was told by Dr. Raliegh Scarlet to call and give April some information.  He call and will be waiting on a call back.

## 2020-05-22 NOTE — Telephone Encounter (Signed)
Med Rec only.

## 2020-05-23 ENCOUNTER — Encounter (INDEPENDENT_AMBULATORY_CARE_PROVIDER_SITE_OTHER): Payer: Self-pay | Admitting: Family Medicine

## 2020-06-04 ENCOUNTER — Ambulatory Visit (INDEPENDENT_AMBULATORY_CARE_PROVIDER_SITE_OTHER): Payer: Medicare Other | Admitting: Family Medicine

## 2020-06-04 ENCOUNTER — Encounter (INDEPENDENT_AMBULATORY_CARE_PROVIDER_SITE_OTHER): Payer: Self-pay | Admitting: Family Medicine

## 2020-06-04 ENCOUNTER — Other Ambulatory Visit: Payer: Self-pay

## 2020-06-04 VITALS — BP 121/70 | HR 76 | Temp 97.7°F | Ht 68.0 in | Wt 267.0 lb

## 2020-06-04 DIAGNOSIS — Z6841 Body Mass Index (BMI) 40.0 and over, adult: Secondary | ICD-10-CM | POA: Diagnosis not present

## 2020-06-04 DIAGNOSIS — E1169 Type 2 diabetes mellitus with other specified complication: Secondary | ICD-10-CM | POA: Diagnosis not present

## 2020-06-04 DIAGNOSIS — E611 Iron deficiency: Secondary | ICD-10-CM | POA: Diagnosis not present

## 2020-06-04 DIAGNOSIS — E785 Hyperlipidemia, unspecified: Secondary | ICD-10-CM | POA: Diagnosis not present

## 2020-06-04 DIAGNOSIS — E119 Type 2 diabetes mellitus without complications: Secondary | ICD-10-CM | POA: Insufficient documentation

## 2020-06-04 MED ORDER — ATORVASTATIN CALCIUM 20 MG PO TABS: 20.0000 mg | ORAL_TABLET | Freq: Every day | ORAL | 0 refills | Status: DC

## 2020-06-05 ENCOUNTER — Encounter (INDEPENDENT_AMBULATORY_CARE_PROVIDER_SITE_OTHER): Payer: Self-pay | Admitting: Family Medicine

## 2020-06-06 NOTE — Progress Notes (Addendum)
Chief Complaint:   OBESITY Bryan Wang is here to discuss his progress with his obesity treatment plan along with follow-up of his obesity related diagnoses. Bryan Wang is on the Category 2 Plan and states he is following his eating plan approximately 90% of the time. Bryan Wang states he is doing yard work for 60 minutes 4 times per week.  Today's visit was #: 5 Starting weight: 299 lbs Starting date: 04/03/2020 Today's weight: 267 lbs Today's date: 06/04/2020 Total lbs lost to date: 32 lbs Total lbs lost since last in-office visit: 4 lbs  Interim History: Bryan Wang says the plan is going well.  He is following it mostly, but has his fried fish once a week, but gets a side salad instead of hush puppies or potatoes.  He also has been doing some fried fish at home in peanut oil that tastes really good, and he cannot believe it.  No low blood sugars or symptoms.  He has been a little more tired than usual, but denies dyspnea on exertion more than usual, chest pain, low blood sugars, etc.  Says he feels no new symptoms except just a little more tired this past week than usual.  NO other sx   Assessment/Plan:   Meds ordered this encounter  Medications  . atorvastatin (LIPITOR) 20 MG tablet    Sig: Take 1 tablet (20 mg total) by mouth at bedtime.    Dispense:  90 tablet    Refill:  0    1. Type 2 diabetes mellitus with other specified complication, without long-term current use of insulin (HCC) FBS are around 100.  Today was 71, but no hypoglycemic symptoms or concerns.  Had a FBS of 145 one morning, but mostly all over 100.  Plan:  Continue same medications with same doses as this [facility]. Gets refills from the [place].  Most recent A1c 7.4 done at the [place].Marland Kitchen   Home blood sugar monitoring daily and call prior to office visit with any concerns/ lows etc.  Eat regularly, don't skip meals   Lab Results  Component Value Date   HGBA1C 7.4 (H) 05/03/2020   Lab Results  Component Value Date    LDLCALC 115 (H) 04/03/2020   CREATININE 0.97 04/03/2020   Lab Results  Component Value Date   INSULIN 50.7 (H) 04/03/2020     2. Hyperlipidemia associated with type 2 diabetes mellitus (Bryan Wang) Bryan Wang has hyperlipidemia and has been trying to improve his cholesterol levels with intensive lifestyle modification including a low saturated fat diet, exercise and weight loss. He denies any chest pain, claudication or myalgias.  He is taking Lipitor 20 mg daily with no side effects.  Plan:  Continue medication.  Continue prudent nutritional plan and weight loss.  Repeat FLP and CMP in early November since we doubled the dose at the end of September.  -Refill atorvastatin (LIPITOR) 20 MG tablet; Take 1 tablet (20 mg total) by mouth at bedtime.  Dispense: 90 tablet; Refill: 0   Lab Results  Component Value Date   ALT 41 04/03/2020   AST 30 04/03/2020   ALKPHOS 59 04/03/2020   BILITOT 0.4 04/03/2020   Lab Results  Component Value Date   CHOL 182 04/03/2020   HDL 45 04/03/2020   LDLCALC 115 (H) 04/03/2020   TRIG 124 04/03/2020   CHOLHDL 4.0 04/03/2020   3. Iron deficiency [facility] and iron saturation levels.  He went from  taking an iron supplement 2-3 times per week to taking it daily recently.  Plan:  Recheck labs.  Continue daily iron supplement.  Continue prudent nutritional plan with iron-rich diet.  CBC Latest Ref Rng & Units 04/03/2020 06/28/2010 05/07/2009  WBC 3.4 - 10.8 x10E3/uL 11.0(H) 9.2 5.7  Hemoglobin 13.0 - 17.7 g/dL 14.8 15.5 15.4  Hematocrit 37.5 - 51.0 % 44.7 44.4 43.9  Platelets 150 - 450 x10E3/uL 322 306 286   Lab Results  Component Value Date   VITAMINB12 343 04/03/2020   4. Class 3 severe obesity with serious comorbidity and body mass index (BMI) of 40.0 to 44.9 in adult, unspecified obesity type [facility])  Bryan Wang is currently in the action stage of change. As such, his goal is to continue with weight loss efforts. He has  agreed to the Category 2 Plan.   Exercise goals: As is.  Behavioral modification strategies: decreasing simple carbohydrates, decreasing eating out, keeping healthy foods in the home and celebration eating strategies.  Bryan Wang has agreed to follow-up with our clinic in 2 weeks. He was informed of the importance of frequent follow-up visits to maximize his success with intensive lifestyle modifications for his multiple health conditions.   Objective:   Blood pressure 121/70, pulse 76, temperature 97.7 F (36.5 C), height 5\' 8"  (1.727 m), weight 267 lb (121.1 kg), SpO2 99 %. Body mass index is 40.6 kg/m.  General: Cooperative, alert, well developed, in no acute distress. HEENT: Conjunctivae and lids unremarkable. Cardiovascular: Regular rhythm.  Lungs: Normal work of breathing. Neurologic: No focal deficits.   Lab Results  Component Value Date   CREATININE 0.97 04/03/2020   BUN 14 04/03/2020   NA 134 04/03/2020   K 4.6 04/03/2020   CL 94 (L) 04/03/2020   CO2 23 04/03/2020   Lab Results  Component Value Date   ALT 41 04/03/2020   AST 30 04/03/2020   ALKPHOS 59 04/03/2020   BILITOT 0.4 04/03/2020   Lab Results  Component Value Date   HGBA1C 8.5 (H) 04/03/2020   Lab Results  Component Value Date   INSULIN 50.7 (H) 04/03/2020   Lab Results  Component Value Date   TSH 3.860 04/03/2020   Lab Results  Component Value Date   CHOL 182 04/03/2020   HDL 45 04/03/2020   LDLCALC 115 (H) 04/03/2020   TRIG 124 04/03/2020   CHOLHDL 4.0 04/03/2020   Lab Results  Component Value Date   WBC 11.0 (H) 04/03/2020   HGB 14.8 04/03/2020   HCT 44.7 04/03/2020   MCV 88 04/03/2020   PLT 322 04/03/2020   Obesity Behavioral Intervention:   Approximately 8+ minutes were spent on the discussion below.  ASK: We discussed the diagnosis of obesity with Bryan Wang today and Bryan Wang agreed to give [name] permission to discuss obesity behavioral modification therapy today.  ASSESS: Bryan Wang has  the diagnosis of obesity and his BMI today is 40.7. Bryan Wang is in the action stage of change.   ADVISE: Bryan Wang was educated on the multiple health risks of obesity as well as the benefit of weight loss to improve his health. He was advised of the need for long term treatment and the importance of lifestyle modifications to improve his current health and to decrease his risk of future health problems.  AGREE: Multiple dietary modification options and treatment options were discussed and Bryan Wang agreed to follow the recommendations documented in the above note.  ARRANGE: Bryan Wang was educated on the importance of frequent visits to treat obesity  as outlined per CMS and USPSTF guidelines and agreed to schedule his next follow up appointment today.  Attestation Statements:   Reviewed by clinician on day of visit: allergies, medications, problem list, medical history, surgical history, family history, social history, and previous encounter notes.  I, Water quality scientist, CMA, am acting as Location manager for [employer], DO.  I have reviewed the above documentation for accuracy and completeness, and I agree with the above. Marjory Sneddon, D.O.  The [place] was signed into law in 2016 which includes the topic of electronic health records.  This provides immediate access to information in MyChart.  This includes consultation notes, operative notes, office notes, lab results and pathology reports.  If you have any questions about what you read please let us know at your next visit so we can discuss your concerns and take corrective action if need be.  We are right here with you.

## 2020-06-07 NOTE — Telephone Encounter (Signed)
Dr Opalski, please advise  

## 2020-06-07 NOTE — Telephone Encounter (Signed)
Let pt know he is very funny

## 2020-06-14 ENCOUNTER — Other Ambulatory Visit (INDEPENDENT_AMBULATORY_CARE_PROVIDER_SITE_OTHER): Payer: Self-pay

## 2020-06-20 ENCOUNTER — Other Ambulatory Visit: Payer: Self-pay

## 2020-06-20 ENCOUNTER — Encounter (INDEPENDENT_AMBULATORY_CARE_PROVIDER_SITE_OTHER): Payer: Self-pay | Admitting: Family Medicine

## 2020-06-20 ENCOUNTER — Ambulatory Visit (INDEPENDENT_AMBULATORY_CARE_PROVIDER_SITE_OTHER): Payer: Medicare Other | Admitting: Family Medicine

## 2020-06-20 VITALS — BP 112/68 | HR 84 | Temp 97.9°F | Ht 68.0 in | Wt 265.0 lb

## 2020-06-20 DIAGNOSIS — E1159 Type 2 diabetes mellitus with other circulatory complications: Secondary | ICD-10-CM | POA: Diagnosis not present

## 2020-06-20 DIAGNOSIS — D649 Anemia, unspecified: Secondary | ICD-10-CM | POA: Insufficient documentation

## 2020-06-20 DIAGNOSIS — E785 Hyperlipidemia, unspecified: Secondary | ICD-10-CM

## 2020-06-20 DIAGNOSIS — Z6841 Body Mass Index (BMI) 40.0 and over, adult: Secondary | ICD-10-CM

## 2020-06-20 DIAGNOSIS — I152 Hypertension secondary to endocrine disorders: Secondary | ICD-10-CM

## 2020-06-20 DIAGNOSIS — E1169 Type 2 diabetes mellitus with other specified complication: Secondary | ICD-10-CM | POA: Diagnosis not present

## 2020-06-20 DIAGNOSIS — E7849 Other hyperlipidemia: Secondary | ICD-10-CM | POA: Insufficient documentation

## 2020-06-20 DIAGNOSIS — D508 Other iron deficiency anemias: Secondary | ICD-10-CM

## 2020-06-20 NOTE — Progress Notes (Signed)
Chief Complaint:   OBESITY Tyrrell is here to discuss his progress with his obesity treatment plan along with follow-up of his obesity related diagnoses. Kelly is on the Category 2 Plan and states he is following his eating plan approximately 80% of the time. Jabar states he is doing yard work for 2-3 hours minutes 2 times per week.  Today's visit was #: 6 Starting weight: 299 lbs Starting date: 04/03/2020 Today's weight: 265 lbs Today's date: 06/23/2020 Total lbs lost to date: 34 lbs Total lbs lost since last in-office visit: 2 lbs Total weight loss percentage to date: -11.37%  Interim History: Antavius did not do as much food prep/planning as usual.  He volunteered at [employer], etc., thus ate out, and poorly, more than usual.  He had a hot dog and onion rings for one meal.  Assessment/Plan:   1. Type 2 diabetes mellitus with other specified complication, without long-term current use of insulin (HCC) FBS 100-111.  No lows or anything over 127.  Denies concerns or new symptoms.  Plan:  Continue metformin and glucotrol at current doses.  Lab Results  Component Value Date   HGBA1C 7.4 05/03/2020   HGBA1C 8.5 (H) 04/03/2020   Lab Results  Component Value Date   LDLCALC 115 (H) 04/03/2020   CREATININE 0.97 04/03/2020   Lab Results  Component Value Date   INSULIN 50.7 (H) 04/03/2020   2. Hyperlipidemia associated with type 2 diabetes mellitus ([place]) Tolerating increased dose of Lipitor well - 40 mg daily now.  Denies side effects or concerns.  Plan:  Lipitor increased to 40 mg daily in early September.  Reminded him to have his cholesterol and liver function rechecked after being on the increased dose for about 2-3 months.  Lab Results  Component Value Date   ALT 41 04/03/2020   AST 30 04/03/2020   ALKPHOS 59 04/03/2020   BILITOT 0.4 04/03/2020   Lab Results  Component Value Date   CHOL 182 04/03/2020   HDL 45 04/03/2020   LDLCALC 115 (H) 04/03/2020   TRIG  124 04/03/2020   CHOLHDL 4.0 04/03/2020   3. Hypertension associated with type 2 diabetes mellitus ([place]) On ACE, lisinopril.  Denies intolerance or new onset symptoms or concerns.  Plan:  Blood pressure is at goal.  Continue current medications at current doses.   BP Readings from Last 3 Encounters:  06/20/20 112/68  06/04/20 121/70  05/16/20 110/66   4. Other iron deficiency anemia Improving fatigue and energy levels from prior.  Denies side effects of medications.  Plan:  We increased his dose of ferrous sulfate at the end of September.  Will need recheck anemia panel and CBC with other labs (FLP, ALT, A1c) in the next couple of weeks.  He wants to do them at the [place] due to billing issues with [facility].   CBC Latest Ref Rng & Units 04/03/2020 06/28/2010 05/07/2009  WBC 3.4 - 10.8 x10E3/uL 11.0(H) 9.2 5.7  Hemoglobin 13.0 - 17.7 g/dL 14.8 15.5 15.4  Hematocrit 37.5 - 51.0 % 44.7 44.4 43.9  Platelets 150 - 450 x10E3/uL 322 306 286   Lab Results  Component Value Date   IRON 44 05/03/2020   TIBC 286 05/03/2020   FERRITIN 80.9 05/03/2020   Lab Results  Component Value Date   VITAMINB12 343 04/03/2020   5. Class 3 severe obesity with serious comorbidity and body mass index (BMI) of 40.0 to 44.9 in adult, unspecified obesity type ([place])  [name]  is currently in the action stage of change. As such, his goal is to continue with weight loss efforts. He has agreed to the Category 2 Plan.   Exercise goals: For substantial health benefits, adults should do at least 150 minutes (2 hours and 30 minutes) a week of moderate-intensity, or 75 minutes (1 hour and 15 minutes) a week of vigorous-intensity aerobic physical activity, or an equivalent combination of moderate- and vigorous-intensity aerobic activity. Aerobic activity should be performed in episodes of at least 10 minutes, and preferably, it should be spread throughout the week.  Behavioral modification strategies: decreasing eating out,  meal planning and cooking strategies, travel eating strategies and planning for success.  Saben has agreed to follow-up with our clinic in 2 weeks.  He will need FLP, ALT, A1c, CBC, and iron panel in the next 2-4 weeks (tells me he will get them at the [place]). He was informed of the importance of frequent follow-up visits to maximize his success with intensive lifestyle modifications for his multiple health conditions.   Objective:   Blood pressure 112/68, pulse 84, temperature 97.9 F (36.6 C), height 5\' 8"  (1.727 m), weight 265 lb (120.2 kg), SpO2 97 %. Body mass index is 40.29 kg/m.  General: Cooperative, alert, well developed, in no acute distress. HEENT: Conjunctivae and lids unremarkable. Cardiovascular: Regular rhythm.  Lungs: Normal work of breathing. Neurologic: No focal deficits.   Lab Results  Component Value Date   CREATININE 0.97 04/03/2020   BUN 14 04/03/2020   NA 134 04/03/2020   K 4.6 04/03/2020   CL 94 (L) 04/03/2020   CO2 23 04/03/2020   Lab Results  Component Value Date   ALT 41 04/03/2020   AST 30 04/03/2020   ALKPHOS 59 04/03/2020   BILITOT 0.4 04/03/2020   Lab Results  Component Value Date   HGBA1C 7.4 05/03/2020   HGBA1C 8.5 (H) 04/03/2020   Lab Results  Component Value Date   INSULIN 50.7 (H) 04/03/2020   Lab Results  Component Value Date   TSH 3.860 04/03/2020   Lab Results  Component Value Date   CHOL 182 04/03/2020   HDL 45 04/03/2020   LDLCALC 115 (H) 04/03/2020   TRIG 124 04/03/2020   CHOLHDL 4.0 04/03/2020   Lab Results  Component Value Date   WBC 11.0 (H) 04/03/2020   HGB 14.8 04/03/2020   HCT 44.7 04/03/2020   MCV 88 04/03/2020   PLT 322 04/03/2020   Lab Results  Component Value Date   IRON 44 05/03/2020   TIBC 286 05/03/2020   FERRITIN 80.9 05/03/2020   Obesity Behavioral Intervention:   Approximately 15 minutes were spent on the discussion below.  ASK: We discussed the diagnosis of obesity with [name] today and  Elijha agreed to give [name] permission to discuss obesity behavioral modification therapy today.  ASSESS: Treshawn has the diagnosis of obesity and his BMI today is 40.4. Ingram is in the action stage of change.   ADVISE: Kartier was educated on the multiple health risks of obesity as well as the benefit of weight loss to improve his health. He was advised of the need for long term treatment and the importance of lifestyle modifications to improve his current health and to decrease his risk of future health problems.  AGREE: Multiple dietary modification options and treatment options were discussed and Amel agreed to follow the recommendations documented in the above note.  ARRANGE: Cayton was educated on the importance of frequent visits to treat obesity as  outlined per CMS and USPSTF guidelines and agreed to schedule his next follow up appointment today.  Attestation Statements:   Reviewed by clinician on day of visit: allergies, medications, problem list, medical history, surgical history, family history, social history, and previous encounter notes.  I, Water quality scientist, CMA, am acting as Location manager for [employer], DO.  I have reviewed the above documentation for accuracy and completeness, and I agree with the above. Marjory Sneddon, D.O.  The [place] was signed into law in 2016 which includes the topic of electronic health records.  This provides immediate access to information in MyChart.  This includes consultation notes, operative notes, office notes, lab results and pathology reports.  If you have any questions about what you read please let us know at your next visit so we can discuss your concerns and take corrective action if need be.  We are right here with you.

## 2020-07-04 ENCOUNTER — Ambulatory Visit (INDEPENDENT_AMBULATORY_CARE_PROVIDER_SITE_OTHER): Payer: Medicare Other | Admitting: Family Medicine

## 2020-07-04 ENCOUNTER — Other Ambulatory Visit: Payer: Self-pay

## 2020-07-04 ENCOUNTER — Encounter (INDEPENDENT_AMBULATORY_CARE_PROVIDER_SITE_OTHER): Payer: Self-pay | Admitting: Family Medicine

## 2020-07-04 VITALS — BP 101/64 | HR 90 | Temp 97.9°F | Ht 68.0 in | Wt 265.0 lb

## 2020-07-04 DIAGNOSIS — E1169 Type 2 diabetes mellitus with other specified complication: Secondary | ICD-10-CM

## 2020-07-04 DIAGNOSIS — E785 Hyperlipidemia, unspecified: Secondary | ICD-10-CM

## 2020-07-04 DIAGNOSIS — I152 Hypertension secondary to endocrine disorders: Secondary | ICD-10-CM

## 2020-07-04 DIAGNOSIS — Z6841 Body Mass Index (BMI) 40.0 and over, adult: Secondary | ICD-10-CM

## 2020-07-04 DIAGNOSIS — E611 Iron deficiency: Secondary | ICD-10-CM | POA: Diagnosis not present

## 2020-07-04 DIAGNOSIS — E1159 Type 2 diabetes mellitus with other circulatory complications: Secondary | ICD-10-CM | POA: Diagnosis not present

## 2020-07-04 MED ORDER — GLIPIZIDE 10 MG PO TABS: 5.0000 mg | ORAL_TABLET | Freq: Two times a day (BID) | ORAL | Status: DC

## 2020-07-04 MED ORDER — ATORVASTATIN CALCIUM 40 MG PO TABS: 40.0000 mg | ORAL_TABLET | Freq: Every day | ORAL | 0 refills | Status: DC

## 2020-07-10 NOTE — Progress Notes (Signed)
Chief Complaint:   OBESITY Bryan Wang is here to discuss his progress with his obesity treatment plan along with follow-up of his obesity related diagnoses. Bryan Wang is on the Category 2 Plan and states he is following his eating plan approximately 85% of the time. Bryan Wang states he is doing yard work 60 minutes 4-5 times per week.  Today's visit was #: 7 Starting weight: 299 lbs Starting date: 04/03/2020 Today's weight: 265 lbs Today's date: 07/04/2020 Total lbs lost to date: 34 lbs Total lbs lost since last in-office visit: 0 lbs Total weight loss percentage to date: -11.37%  Interim History: Bryan Wang has skipped breakfast many days these past couple of weeks. He knows that by not eating he is not losing weight. Bryan Wang goes to the [place] in mid December and needs to have a BMI <40 for his knee replacement.   Assessment/Plan:    Meds ordered this encounter  Medications  . glipiZIDE (GLUCOTROL) 10 MG tablet    Sig: Take 0.5 tablets (5 mg total) by mouth 2 (two) times daily.  Marland Kitchen atorvastatin (LIPITOR) 40 MG tablet    Sig: Take 1 tablet (40 mg total) by mouth at bedtime.    Dispense:  90 tablet    Refill:  0     1. Type 2 diabetes mellitus with other specified complication, without long-term current use of insulin (HCC) Bryan Wang's blood sugars average 100. His high is 127 and low is 74. Asx, w/o concerns  Lab Results  Component Value Date   HGBA1C 7.4 05/03/2020   HGBA1C 8.5 (H) 04/03/2020   Lab Results  Component Value Date   INSULIN 50.7 (H) 04/03/2020   Lab Results  Component Value Date   LDLCALC 115 (H) 04/03/2020   CREATININE 0.97 04/03/2020    Plan:  Reviewed with pt:  - Counseled patient on pathophysiology of disease and discussed good blood sugar control is important to decrease the likelihood of diabetic complications such as nephropathy, neuropathy, limb loss, blindness, coronary artery disease, and death.  Intensive lifestyle modification including diet, exercise  and weight loss are the first line of treatment for diabetes.    - Continue home blood sugar monitoring regularly - closely- especially with continued wt loss.  Reviewed blood sugar goals.  Reminded if pt feels poorly- check BS and BP at that time.   - Eat on a regular basis- no skipping or going long periods without eating.  We discussed hypoglycemia prevention. - Any concerns about medicines should be directed at the prescribing provider - Importance of f/up with PCP and all other specialists as scheduled was stressed to pt today - Pt should contact their endocrinologist or PCP as they see fit with any Q's/ concerns with what we discuss.   - We will recheck Newell's A1c the 1st or 2nd week of December.  - He will continue on his current dose of Metformin 1000 mg twice a day and Glipizide 5 mg twice ad day. Bryan Wang denies need for refills because he gets his refills from his PCP at [place].       2. Hyperlipidemia associated with type 2 diabetes mellitus (Bryan Wang)  We increased Bryan Wang's Lipitor at the end of August/early September.   He is tolerating it well and has no issues or concerns at 40 mg every night.   Plan:  Bryan Wang reports compliance with meds and/or treatment plan such as low saturated and trans fat low cholesterol meal plan  - We will recheck Khayree's ALT  and FLP (since increase in Lipitor early Sept) around the 1st or 2nd week of December.  Refill- atorvastatin (LIPITOR) 40 MG tablet; Take 1 tablet (40 mg total) by mouth at bedtime.  Dispense: 90 tablet; Refill: 0  - Rec: aerobic activity with eventual goal 150+ min wk plus 2 days/ week of resistance strength training   - Cardiovascular risk and specific lipid/LDL goals reviewed.  We discussed several lifestyle modifications today and Neven will continue to work on diet, exercise and weight loss efforts.   Last lipid panel as follows:  Lab Results  Component Value Date   CHOL 182 04/03/2020   HDL 45 04/03/2020    LDLCALC 115 (H) 04/03/2020   TRIG 124 04/03/2020   CHOLHDL 4.0 04/03/2020    Lab Results  Component Value Date   ALT 41 04/03/2020    The 10-year ASCVD risk score Bryan Wang DC Jr., et al., 2013) is: 23.9%  - Will continue routine screening as patient continues with health goals and weight loss journey    3. Iron deficiency We started Bryan Wang on Iron supplements daily in September. He denies issues with constipation or other symptoms.   Plan:  We will recheck CBC and iron panel in early December, and Bryan Wang will continue his current dose of iron medication.   4. Hypertension associated with type 2 diabetes mellitus ([place]) Dawan denies dizziness or any new onset symptoms or concerns. His blood pressure is stable at home (around what it is here today). Bryan Wang denies hypotension symptoms.  Plan: Iren will continue HCTZ and lisinopril at his current dose. He will monitor blood pressure and blood sugar at home regularly every 1-2 days.    5. Class 3 severe obesity with serious comorbidity and body mass index (BMI) of 40.0 to 44.9 in adult, unspecified obesity type [facility]) Walton is currently in the action stage of change. As such, his goal is to continue with weight loss efforts. He has agreed to the Category 2 Plan.   Exercise goals: As is.  Behavioral modification strategies: meal planning and cooking strategies, holiday eating strategies , celebration eating strategies and planning for success.   Clete has agreed to follow-up with our clinic in 2 weeks.  Told to come fasting to next appt- we doubled lipitor end of Aug and pt will need repeat labs early Dec- FLP, ALT, A1c will need cbc, Iron panel since inc dose of Ferrrous sulfate.    He was informed of the importance of frequent follow-up visits to maximize his success with intensive lifestyle modifications for his multiple health conditions.    Objective:   Blood pressure 101/64, pulse 90, temperature 97.9 F (36.6 C), height  5\' 8"  (1.727 m), weight 265 lb (120.2 kg), SpO2 97 %. Body mass index is 40.29 kg/m.  General: Cooperative, alert, well developed, in no acute distress. HEENT: Conjunctivae and lids unremarkable. Cardiovascular: Regular rhythm.  Lungs: Normal work of breathing. Neurologic: No focal deficits.   Lab Results  Component Value Date   CREATININE 0.97 04/03/2020   BUN 14 04/03/2020   NA 134 04/03/2020   K 4.6 04/03/2020   CL 94 (L) 04/03/2020   CO2 23 04/03/2020   Lab Results  Component Value Date   ALT 41 04/03/2020   AST 30 04/03/2020   ALKPHOS 59 04/03/2020   BILITOT 0.4 04/03/2020   Lab Results  Component Value Date   HGBA1C 7.4 05/03/2020   HGBA1C 8.5 (H) 04/03/2020   Lab Results  Component Value Date  INSULIN 50.7 (H) 04/03/2020   Lab Results  Component Value Date   TSH 3.860 04/03/2020   Lab Results  Component Value Date   CHOL 182 04/03/2020   HDL 45 04/03/2020   LDLCALC 115 (H) 04/03/2020   TRIG 124 04/03/2020   CHOLHDL 4.0 04/03/2020   Lab Results  Component Value Date   WBC 11.0 (H) 04/03/2020   HGB 14.8 04/03/2020   HCT 44.7 04/03/2020   MCV 88 04/03/2020   PLT 322 04/03/2020   Lab Results  Component Value Date   IRON 44 05/03/2020   TIBC 286 05/03/2020   FERRITIN 80.9 05/03/2020    Obesity Behavioral Intervention:   Approximately 15 minutes were spent on the discussion below.  ASK: We discussed the diagnosis of obesity with Bryan Wang today and Laval agreed to give [name] permission to discuss obesity behavioral modification therapy today.  ASSESS: Mortimer has the diagnosis of obesity and his BMI today is 40.3. Graig is in the action stage of change.   ADVISE: Alon was educated on the multiple health risks of obesity as well as the benefit of weight loss to improve his health. He was advised of the need for long term treatment and the importance of lifestyle modifications to improve his current health and to decrease his risk of future health  problems.  AGREE: Multiple dietary modification options and treatment options were discussed and Derak agreed to follow the recommendations documented in the above note.  ARRANGE: Zylen was educated on the importance of frequent visits to treat obesity as outlined per CMS and USPSTF guidelines and agreed to schedule his next follow up appointment today.  Attestation Statements:   Reviewed by clinician on day of visit: allergies, medications, problem list, medical history, surgical history, family history, social history, and previous encounter notes.  Coral Ceo, am acting as Location manager for [employer], DO.  I have reviewed the above documentation for accuracy and completeness, and I agree with the above. Marjory Sneddon, D.O.  The [place] was signed into law in 2016 which includes the topic of electronic health records.  This provides immediate access to information in MyChart.  This includes consultation notes, operative notes, office notes, lab results and pathology reports.  If you have any questions about what you read please let us know at your next visit so we can discuss your concerns and take corrective action if need be.  We are right here with you.

## 2020-07-18 ENCOUNTER — Encounter (INDEPENDENT_AMBULATORY_CARE_PROVIDER_SITE_OTHER): Payer: Self-pay | Admitting: Family Medicine

## 2020-07-18 ENCOUNTER — Ambulatory Visit (INDEPENDENT_AMBULATORY_CARE_PROVIDER_SITE_OTHER): Payer: Medicare Other | Admitting: Family Medicine

## 2020-07-18 ENCOUNTER — Other Ambulatory Visit: Payer: Self-pay

## 2020-07-18 VITALS — BP 130/72 | HR 63 | Temp 97.6°F | Ht 68.0 in | Wt 263.0 lb

## 2020-07-18 DIAGNOSIS — E1169 Type 2 diabetes mellitus with other specified complication: Secondary | ICD-10-CM | POA: Diagnosis not present

## 2020-07-18 DIAGNOSIS — E611 Iron deficiency: Secondary | ICD-10-CM | POA: Diagnosis not present

## 2020-07-18 DIAGNOSIS — Z6841 Body Mass Index (BMI) 40.0 and over, adult: Secondary | ICD-10-CM

## 2020-07-18 DIAGNOSIS — E559 Vitamin D deficiency, unspecified: Secondary | ICD-10-CM | POA: Diagnosis not present

## 2020-07-18 DIAGNOSIS — E1159 Type 2 diabetes mellitus with other circulatory complications: Secondary | ICD-10-CM

## 2020-07-18 DIAGNOSIS — E785 Hyperlipidemia, unspecified: Secondary | ICD-10-CM

## 2020-07-18 DIAGNOSIS — Z9189 Other specified personal risk factors, not elsewhere classified: Secondary | ICD-10-CM | POA: Insufficient documentation

## 2020-07-18 DIAGNOSIS — I152 Hypertension secondary to endocrine disorders: Secondary | ICD-10-CM

## 2020-07-18 NOTE — Progress Notes (Addendum)
Chief Complaint:   OBESITY Cove is here to discuss his progress with his obesity treatment plan along with follow-up of his obesity related diagnoses. Muhannad is on the Category 2 Plan and states he is following his eating plan approximately 70% of the time. Jermal states he is exercising 0 minutes 0 times per week.  Today's visit was #: 8 Starting weight: 299 lbs Starting date: 04/03/2020 Today's weight: 263 lbs Today's date: 07/28/2020 Total lbs lost to date: 36 lbs Total lbs lost since last in-office visit: 2 lbs Total weight loss percentage to date: -12.04  Interim History:  Vanessa went to the [place] doctor concerning his knee. He is scheduled to see Dr. Ninfa Linden of orthopedics on Dec 9th regarding his knee replacement surgery.   Plan:  Jewell declines labs today because he ate this morning but will come fasting to his next office visit. He is requesting additional labwork in preparation for his knee surgery.   Assessment/Plan:    Lab Orders     Comprehensive metabolic panel     CBC with Differential/Platelet     Hemoglobin A1c     Lipid panel     VITAMIN D 25 Hydroxy (Vit-D Deficiency, Fractures)     Iron, TIBC and Ferritin Panel     Transferrin   1. Type 2 diabetes mellitus with other specified complication, without long-term current use of insulin (HCC) Hamza is prescribed Metformin 1000 mg and Glucotrol 10 mg. He reports fasting blood sugars range from 76-105. He denies lows or issues and hs no medication intolerance. When I reviewed medications with Katlin, he reports that he is taking them as written.  Plan: Continue Metformin and Glucotrol at current doses. Continue prudent nutritional plan and weight loss. Check A1c at next office visit.  Obtain labs at next OV: - Comprehensive metabolic panel - CBC with Differential/Platelet - Hemoglobin A1c  2. Hyperlipidemia associated with type 2 diabetes mellitus ([place]) Jalik is prescribed Lipitor 40 mg. He has  hyperlipidemia and has been trying to improve his cholesterol levels with intensive lifestyle modification including a low saturated fat diet, exercise and weight loss. He denies any chest pain, claudication or myalgias.  Lab Results  Component Value Date   ALT 41 04/03/2020   AST 30 04/03/2020   ALKPHOS 59 04/03/2020   BILITOT 0.4 04/03/2020   Lab Results  Component Value Date   CHOL 182 04/03/2020   HDL 45 04/03/2020   LDLCALC 115 (H) 04/03/2020   TRIG 124 04/03/2020   CHOLHDL 4.0 04/03/2020    Plan: Recheck levels at next office visit since Lipitor was increased at the end of August.  Obtain at next OV: - Comprehensive metabolic panel - Lipid panel  3. Iron deficiency Ida is taking Ferrous sulfate 325 mg daily. He is tolerating it well with no issues.  Plan: Continue medication daily and check labs at next office visit.  Obtain at next OV: - Comprehensive metabolic panel - CBC with Differential/Platelet - Iron, TIBC and Ferritin Panel - Transferrin  4. Hypertension associated with type 2 diabetes mellitus ([place]) Timo is prescribed HCTZ 25 mg and lisinopril 2.5 mg. He denies issue or symptoms.  Plan: Jayceion's blood pressure is at goal. Continue medication as prescribed. Continue weight loss, low salt intake, prudent nutritional plan.  Obtain labs at next ov: - Comprehensive metabolic panel  5. Vitamin D deficiency Shadoe will have his vitamin d level checked at his next office visit. He is currently taking no vitamin  D supplement. He denies nausea, vomiting or muscle weakness.  Plan: Check levels at next office visit.  Obtain labs at next ov: - VITAMIN D 25 Hydroxy (Vit-D Deficiency, Fractures)   6. Class 3 severe obesity with serious comorbidity and body mass index (BMI) of 40.0 to 44.9 in adult, unspecified obesity type [facility]) Allenmichael is currently in the action stage of change. As such, his goal is to continue with weight loss efforts. He has agreed to the  Category 2 Plan.   Exercise goals: As is  Behavioral modification strategies: increasing lean protein intake, meal planning and cooking strategies, celebration eating strategies and planning for success.  Caylin has agreed to follow-up with our clinic in 2 weeks (will come fasting for blood work). He was informed of the importance of frequent follow-up visits to maximize his success with intensive lifestyle modifications for his multiple health conditions.    Objective:   Blood pressure 130/72, pulse 63, temperature 97.6 F (36.4 C), height 5\' 8"  (1.727 m), weight 263 lb (119.3 kg), SpO2 97 %. Body mass index is 39.99 kg/m.  General: Cooperative, alert, well developed, in no acute distress. HEENT: Conjunctivae and lids unremarkable. Cardiovascular: Regular rhythm.  Lungs: Normal work of breathing. Neurologic: No focal deficits.   Lab Results  Component Value Date   CREATININE 0.97 04/03/2020   BUN 14 04/03/2020   NA 134 04/03/2020   K 4.6 04/03/2020   CL 94 (L) 04/03/2020   CO2 23 04/03/2020   Lab Results  Component Value Date   ALT 41 04/03/2020   AST 30 04/03/2020   ALKPHOS 59 04/03/2020   BILITOT 0.4 04/03/2020   Lab Results  Component Value Date   HGBA1C 7.4 05/03/2020   HGBA1C 7.4 05/03/2020   HGBA1C 8.5 (H) 04/03/2020   Lab Results  Component Value Date   INSULIN 50.7 (H) 04/03/2020   Lab Results  Component Value Date   TSH 3.860 04/03/2020   Lab Results  Component Value Date   CHOL 182 04/03/2020   HDL 45 04/03/2020   LDLCALC 115 (H) 04/03/2020   TRIG 124 04/03/2020   CHOLHDL 4.0 04/03/2020   Lab Results  Component Value Date   WBC 11.0 (H) 04/03/2020   HGB 14.8 04/03/2020   HCT 44.7 04/03/2020   MCV 88 04/03/2020   PLT 322 04/03/2020   Lab Results  Component Value Date   IRON 44 05/03/2020   TIBC 286 05/03/2020   FERRITIN 80.9 05/03/2020     Obesity Behavioral Intervention:   Approximately 12-15 minutes were spent on the discussion  below.   ASK: We discussed the diagnosis of obesity with [name] today and he agreed to give [name] permission to discuss obesity behavioral modification therapy today.   ASSESS: GILLIE CRISCI has the diagnosis of obesity and his Body mass index is 39.99 kg/m.;he is in the action stage of change.    ADVISE: ELIAZ FOUT was educated on the multiple health risks of obesity as well as the benefit of weight loss to improve his health.  [name] was advised of the need for long term treatment and the importance of lifestyle modifications to improve his current health and to decrease his risk of future health problems.   AGREE: Multiple dietary modification options and treatment options were discussed and patient has agreed to follow the recommendations documented in the above note.   ARRANGE: RASHEEM FIGIEL was educated on the importance of frequent visits to treat obesity  as outlined per CMS and USPSTF guidelines and agreed to schedule his next follow up appointment today.    Attestation Statements:   Reviewed by clinician on day of visit: allergies, medications, problem list, medical history, surgical history, family history, social history, and previous encounter notes.  Coral Ceo, am acting as Location manager for [employer], DO.  I have reviewed the above documentation for accuracy and completeness, and I agree with the above. Marjory Sneddon, D.O.  The [place] was signed into law in 2016 which includes the topic of electronic health records.  This provides immediate access to information in MyChart.  This includes consultation notes, operative notes, office notes, lab results and pathology reports.  If you have any questions about what you read please let us know at your next visit so we can discuss your concerns and take corrective action if need be.  We are right here with you.

## 2020-07-19 ENCOUNTER — Other Ambulatory Visit (INDEPENDENT_AMBULATORY_CARE_PROVIDER_SITE_OTHER): Payer: Self-pay

## 2020-07-19 NOTE — Telephone Encounter (Signed)
Dr Opalski, please advise  

## 2020-07-26 ENCOUNTER — Ambulatory Visit (INDEPENDENT_AMBULATORY_CARE_PROVIDER_SITE_OTHER): Payer: Medicare Other | Admitting: Orthopaedic Surgery

## 2020-07-26 ENCOUNTER — Encounter: Payer: Self-pay | Admitting: Orthopaedic Surgery

## 2020-07-26 ENCOUNTER — Ambulatory Visit (INDEPENDENT_AMBULATORY_CARE_PROVIDER_SITE_OTHER): Payer: Medicare Other

## 2020-07-26 DIAGNOSIS — G8929 Other chronic pain: Secondary | ICD-10-CM

## 2020-07-26 DIAGNOSIS — M1712 Unilateral primary osteoarthritis, left knee: Secondary | ICD-10-CM | POA: Insufficient documentation

## 2020-07-26 DIAGNOSIS — M25562 Pain in left knee: Secondary | ICD-10-CM

## 2020-07-26 NOTE — Progress Notes (Signed)
Office Visit Note   Patient: Bryan Wang           Date of Birth: [birth year]           MRN: [id number] Visit Date: 07/26/2020              Requested by: Charlsie Merles, [place] [facility] [address] [place],  [place] [address] PCP: Charlsie Merles, MD   Assessment & Plan: Visit Diagnoses:  1. Chronic pain of left knee   2. Unilateral primary osteoarthritis, left knee     Plan: At this point I have recommended a total knee arthroplasty for his left knee and he agrees with this as well.  I went over his x-rays in detail and showed him a knee replacement model.  I discussed the risk and benefits of surgery.  We talked in detail about his interoperative and postoperative course and what to expect with this type of surgery.  All questions and concerns were answered and addressed.  We will work on getting him scheduled hopefully sometime after the first of the year.  He will continue his weight loss program and his blood glucose control.  Follow-Up Instructions: Return for 2 weeks post-op.   Orders:  Orders Placed This Encounter  Procedures  . XR Knee 1-2 Views Left   No orders of the defined types were placed in this encounter.     Procedures: No procedures performed   Clinical Data: No additional findings.   Subjective: Chief Complaint  Patient presents with  . Left Knee - Pain  The patient is a 69 year old gentleman who is actually the husband of someone of done knee surgery on.  I am seeing him for the first time due to end-stage arthritis of his left knee.  He is a patient of the [place] system.  He has a history of 2 arthroscopic interventions on that left knee and multiple steroid injections.  At this point his left knee pain is daily and is detrimentally affecting his actives daily living, his mobility and his quality of life.  He is in a weight loss program.  He is down from 290pounds to 263 pounds.  He is continuing to lose weight.  He is a diabetic but has a  hemoglobin A1c of around 7.  His left knee pain has gotten worse over the last 12 months.  He has failed all forms of conservative treatment for this including even activity modification and therapy.  He is interested in knee replacement surgery at this standpoint.  HPI  Review of Systems He currently denies any headache, chest pain, shortness of breath, fever, chills, nausea, vomiting  Objective: Vital Signs: There were no vitals taken for this visit.  Physical Exam He is alert and orient x3 and in no acute distress Ortho Exam Examination of his left knee does show varus malalignment.  There is a mild effusion.  There is medial joint line tenderness and lateral tenderness.  There is patellofemoral crepitation.  The patella tracks slightly laterally.  His knee is ligamentously stable with good range of motion. Specialty Comments:  No specialty comments available.  Imaging: XR Knee 1-2 Views Left  Result Date: 07/26/2020 2 views of the left knee show tricompartmental arthritic changes.  There is varus malalignment.  There is complete loss of the medial joint space.  There are osteophytes in all 3 compartments and significant patellofemoral arthritic changes.    PMFS History: Patient Active Problem List   Diagnosis Date Noted  .  Unilateral primary osteoarthritis, left knee 07/26/2020  . At risk for activity intolerance 07/18/2020  . Other hyperlipidemia 06/20/2020  . Hypertension associated with type 2 diabetes mellitus ([place]) 06/20/2020  . Absolute anemia 06/20/2020  . Diabetes mellitus ([place]) 06/04/2020  . Hyperlipidemia associated with type 2 diabetes mellitus ([place]) 06/04/2020  . Iron deficiency 06/04/2020   Past Medical History:  Diagnosis Date  . ADD (attention deficit disorder)   . Autoimmune hepatitis ([place])   . Diabetes ([place])   . Diabetes mellitus without complication ([place])   . Fatty liver   . High cholesterol   . Joint pain   . Left knee pain   . Liver disease   .  Liver problem   . Overweight   . Sleep apnea   . SOB (shortness of breath)   . Swelling of both lower extremities     Family History  Problem Relation Age of Onset  . Diabetes Father     Past Surgical History:  Procedure Laterality Date  . FACIAL FRACTURE SURGERY  1973  . KNEE ARTHROSCOPY  2005  . LIVER BIOPSY  1996  . NECK SURGERY     Social History   Occupational History  . Occupation: retired Dealer, Building control surveyor, Therapist, nutritional, Press photographer  Tobacco Use  . Smoking status: Former Smoker    Types: Cigarettes    Quit date: 1995    Years since quitting: 26.9  . Smokeless tobacco: Never Used  Substance and Sexual Activity  . Alcohol use: No  . Drug use: No  . Sexual activity: Not on file

## 2020-07-31 ENCOUNTER — Telehealth: Payer: Self-pay | Admitting: Orthopaedic Surgery

## 2020-07-31 ENCOUNTER — Other Ambulatory Visit: Payer: Self-pay | Admitting: Orthopaedic Surgery

## 2020-07-31 MED ORDER — TRAMADOL HCL 50 MG PO TABS: 100.0000 mg | ORAL_TABLET | Freq: Four times a day (QID) | ORAL | 0 refills | Status: DC | PRN

## 2020-07-31 NOTE — Telephone Encounter (Signed)
Please advise 

## 2020-07-31 NOTE — Telephone Encounter (Signed)
I sent in some tramadol.

## 2020-07-31 NOTE — Telephone Encounter (Signed)
Patient called he would like some pain medication called in for him. Says he is in a lot of pain. His call back number is [phone]

## 2020-08-01 ENCOUNTER — Ambulatory Visit (INDEPENDENT_AMBULATORY_CARE_PROVIDER_SITE_OTHER): Payer: Medicare Other | Admitting: Family Medicine

## 2020-08-01 ENCOUNTER — Encounter (INDEPENDENT_AMBULATORY_CARE_PROVIDER_SITE_OTHER): Payer: Self-pay | Admitting: Family Medicine

## 2020-08-01 ENCOUNTER — Other Ambulatory Visit: Payer: Self-pay

## 2020-08-01 VITALS — BP 118/67 | HR 69 | Temp 98.2°F | Ht 68.0 in | Wt 263.0 lb

## 2020-08-01 DIAGNOSIS — E785 Hyperlipidemia, unspecified: Secondary | ICD-10-CM

## 2020-08-01 DIAGNOSIS — E1169 Type 2 diabetes mellitus with other specified complication: Secondary | ICD-10-CM

## 2020-08-01 DIAGNOSIS — Z6841 Body Mass Index (BMI) 40.0 and over, adult: Secondary | ICD-10-CM | POA: Diagnosis not present

## 2020-08-01 DIAGNOSIS — Z9189 Other specified personal risk factors, not elsewhere classified: Secondary | ICD-10-CM | POA: Insufficient documentation

## 2020-08-01 DIAGNOSIS — I152 Hypertension secondary to endocrine disorders: Secondary | ICD-10-CM

## 2020-08-01 DIAGNOSIS — E1159 Type 2 diabetes mellitus with other circulatory complications: Secondary | ICD-10-CM | POA: Diagnosis not present

## 2020-08-01 MED ORDER — ATORVASTATIN CALCIUM 40 MG PO TABS: 40.0000 mg | ORAL_TABLET | Freq: Every day | ORAL | 0 refills | Status: DC

## 2020-08-01 MED ORDER — GLIPIZIDE 10 MG PO TABS: 5.0000 mg | ORAL_TABLET | Freq: Two times a day (BID) | ORAL | Status: DC

## 2020-08-01 NOTE — Progress Notes (Signed)
Chief Complaint:   OBESITY Bryan Wang is here to discuss his progress with his obesity treatment plan along with follow-up of his obesity related diagnoses. Bryan Wang is on the Category 2 Plan and states he is following his eating plan approximately 80% of the time. Bryan Wang states he is doing yard work 60 minutes 2-3 times per week.  Today's visit was #: 9 Starting weight: 299 lbs Starting date: 04/03/2020 Today's weight: 263 lbs Today's date: 08/04/2020 Total lbs lost to date: 36 lbs Total lbs lost since last in-office visit: 0 lbs Total weight loss percentage to date: -12.04%  Interim History: Bryan Wang states he had a "country meal" and Christmas party that he attended and the next day his fasting blood sugar was 168 and it was 86 this morning.   Plan:  1. Bryan Wang is fasting today. 2. Bryan Wang requests that his office visit and lab results from today be sent to his Orthopedic doctor, Dr. Ninfa Linden at [facility], which is now in [facility] system.  Assessment/Plan:   1. Type 2 diabetes mellitus with other specified complication, without long-term current use of insulin (HCC) Bryan Wang's fasting blood sugars are in the 80's and 90's. He reports no 70's and says it is never over 100. He is prescribed Metformin and glipizide.  No lows or concerns Medications reviewed. Diabetic ROS: no polyuria or polydipsia, no chest pain, dyspnea or TIA's, no numbness, tingling or pain in extremities.   Lab Results  Component Value Date   HGBA1C 5.9 (H) 08/01/2020   HGBA1C 7.4 05/03/2020   HGBA1C 7.4 05/03/2020   Lab Results  Component Value Date   LDLCALC 51 08/01/2020   CREATININE 0.89 08/01/2020   Lab Results  Component Value Date   INSULIN 50.7 (H) 04/03/2020    Plan: Check labs and continue current treatment plan. Continue home blood sugar monitoring. Refill glipizide for 1 month, as per below. Good blood sugar control is important to decrease the likelihood of diabetic complications such as  nephropathy, neuropathy, limb loss, blindness, coronary artery disease, and death. Intensive lifestyle modification including diet, exercise and weight loss are the first line of treatment for diabetes.   Refill- glipiZIDE (GLUCOTROL) 10 MG tablet; Take 0.5 tablets (5 mg total) by mouth 2 (two) times daily.  2. Hyperlipidemia associated with type 2 diabetes mellitus ([place]) We doubled Bryan Wang's dose of Lipitor at the end of August. He is tolerating it well without side effects or concerns.  Bryan Wang has hyperlipidemia and has been trying to improve his cholesterol levels with intensive lifestyle modification including a low saturated fat diet, exercise and weight loss. He denies any chest pain, claudication or myalgias.  Lab Results  Component Value Date   ALT 43 08/01/2020   AST 31 08/01/2020   ALKPHOS 60 08/01/2020   BILITOT 0.7 08/01/2020   Lab Results  Component Value Date   CHOL 110 08/01/2020   HDL 41 08/01/2020   LDLCALC 51 08/01/2020   TRIG 92 08/01/2020   CHOLHDL 2.7 08/01/2020   Plan: Check labs today. Continue current treatment plan. Cardiovascular risk and specific lipid/LDL goals reviewed.  We discussed several lifestyle modifications today and Bryan Wang will continue to work on diet, exercise and weight loss efforts. Orders and follow up as documented in patient record.   Counseling Intensive lifestyle modifications are the first line treatment for this issue. . Dietary changes: Increase soluble fiber. Decrease simple carbohydrates. . Exercise changes: Moderate to vigorous-intensity aerobic activity 150 minutes per week if tolerated. Marland Kitchen  Lipid-lowering medications: see documented in medical record.  Refill- atorvastatin (LIPITOR) 40 MG tablet; Take 1 tablet (40 mg total) by mouth at bedtime.  Dispense: 90 tablet; Refill: 0  3. Hypertension associated with type 2 diabetes mellitus ([place]) Bryan Wang is prescribed HCTZ and lisinopril.  Review: taking medications as instructed, no  medication side effects noted, no chest pain on exertion, no dyspnea on exertion, no swelling of ankles.   BP Readings from Last 3 Encounters:  08/01/20 118/67  07/18/20 130/72  07/04/20 101/64   Lab Results  Component Value Date   NA 139 08/01/2020   K 4.5 08/01/2020   CO2 27 08/01/2020   GLUCOSE 86 08/01/2020   BUN 18 08/01/2020   CREATININE 0.89 08/01/2020   CALCIUM 9.5 08/01/2020   GFRNONAA 87 08/01/2020   GFRAA 101 08/01/2020   Plan: Bryan Wang blood pressure is at goal today. Continue current treatment plan. Check labs today. He is working on healthy weight loss and exercise to improve blood pressure control. We will watch for signs of hypotension as he continues his lifestyle modifications.  4. At risk for constipation Bryan Wang was given approximately 15 minutes of counseling today regarding prevention of constipation. He was encouraged to increase water and fiber intake.   5. Class 3 severe obesity with serious comorbidity and body mass index (BMI) of 40.0 to 44.9 in adult, unspecified obesity type Bryan Wang) Bryan Wang is currently in the action stage of change. As such, his goal is to continue with weight loss efforts. He has agreed to the Category 2 Plan.   Exercise goals: For substantial health benefits, adults should do at least 150 minutes (2 hours and 30 minutes) a week of moderate-intensity, or 75 minutes (1 hour and 15 minutes) a week of vigorous-intensity aerobic physical activity, or an equivalent combination of moderate- and vigorous-intensity aerobic activity. Aerobic activity should be performed in episodes of at least 10 minutes, and preferably, it should be spread throughout the week.  Behavioral modification strategies: increasing lean protein intake, meal planning and cooking strategies, celebration eating strategies and planning for success.  Bryan Wang has agreed to follow-up with our clinic in 3 weeks. He was informed of the importance of frequent follow-up visits to  maximize his success with intensive lifestyle modifications for his multiple health conditions.   Objective:   Blood pressure 118/67, pulse 69, temperature 98.2 F (36.8 C), height 5\' 8"  (9.563 m), weight 263 lb (119.3 kg), SpO2 98 %. Body mass index is 39.99 kg/m.  General: Cooperative, alert, well developed, in no acute distress. HEENT: Conjunctivae and lids unremarkable. Cardiovascular: Regular rhythm.  Lungs: Normal work of breathing. Neurologic: No focal deficits.   Lab Results  Component Value Date   CREATININE 0.89 08/01/2020   BUN 18 08/01/2020   NA 139 08/01/2020   K 4.5 08/01/2020   CL 99 08/01/2020   CO2 27 08/01/2020   Lab Results  Component Value Date   ALT 43 08/01/2020   AST 31 08/01/2020   ALKPHOS 60 08/01/2020   BILITOT 0.7 08/01/2020   Lab Results  Component Value Date   HGBA1C 5.9 (H) 08/01/2020   HGBA1C 7.4 05/03/2020   HGBA1C 7.4 05/03/2020   HGBA1C 8.5 (H) 04/03/2020   Lab Results  Component Value Date   INSULIN 50.7 (H) 04/03/2020   Lab Results  Component Value Date   TSH 3.860 04/03/2020   Lab Results  Component Value Date   CHOL 110 08/01/2020   HDL 41 08/01/2020   LDLCALC 51 08/01/2020  TRIG 92 08/01/2020   CHOLHDL 2.7 08/01/2020   Lab Results  Component Value Date   WBC 10.3 08/01/2020   HGB 14.4 08/01/2020   HCT 44.4 08/01/2020   MCV 89 08/01/2020   PLT 338 08/01/2020   Lab Results  Component Value Date   IRON 51 08/01/2020   TIBC 253 08/01/2020   FERRITIN 147 08/01/2020    Attestation Statements:   Reviewed by clinician on day of visit: allergies, medications, problem list, medical history, surgical history, family history, social history, and previous encounter notes.  Coral Ceo, am acting as Location manager for [employer], DO.  I have reviewed the above documentation for accuracy and completeness, and I agree with the above. Bryan Wang, D.O.  The [place] was signed into  law in 2016 which includes the topic of electronic health records.  This provides immediate access to information in MyChart.  This includes consultation notes, operative notes, office notes, lab results and pathology reports.  If you have any questions about what you read please let us know at your next visit so we can discuss your concerns and take corrective action if need be.  We are right here with you.

## 2020-08-02 ENCOUNTER — Encounter (INDEPENDENT_AMBULATORY_CARE_PROVIDER_SITE_OTHER): Payer: Self-pay | Admitting: Family Medicine

## 2020-08-02 LAB — IRON,TIBC AND FERRITIN PANEL
Ferritin: 147 ng/mL (ref 30–400)
Iron Saturation: 20 % (ref 15–55)
Iron: 51 ug/dL (ref 38–169)
Total Iron Binding Capacity: 253 ug/dL (ref 250–450)
UIBC: 202 ug/dL (ref 111–343)

## 2020-08-02 LAB — LIPID PANEL
Chol/HDL Ratio: 2.7 ratio (ref 0.0–5.0)
Cholesterol, Total: 110 mg/dL (ref 100–199)
HDL: 41 mg/dL (ref >39)
LDL Chol Calc (NIH): 51 mg/dL (ref 0–99)
Triglycerides: 92 mg/dL (ref 0–149)
VLDL Cholesterol Cal: 18 mg/dL (ref 5–40)

## 2020-08-02 LAB — CBC WITH DIFFERENTIAL/PLATELET
Basophils Absolute: 0.1 10*3/uL (ref 0.0–0.2)
Basos: 1 %
EOS (ABSOLUTE): 0.1 10*3/uL (ref 0.0–0.4)
Eos: 1 %
Hematocrit: 44.4 % (ref 37.5–51.0)
Hemoglobin: 14.4 g/dL (ref 13.0–17.7)
Immature Grans (Abs): 0.1 10*3/uL (ref 0.0–0.1)
Immature Granulocytes: 1 %
Lymphocytes Absolute: 1 10*3/uL (ref 0.7–3.1)
Lymphs: 10 %
MCH: 28.9 pg (ref 26.6–33.0)
MCHC: 32.4 g/dL (ref 31.5–35.7)
MCV: 89 fL (ref 79–97)
Monocytes Absolute: 0.6 10*3/uL (ref 0.1–0.9)
Monocytes: 6 %
Neutrophils Absolute: 8.5 10*3/uL — ABNORMAL HIGH (ref 1.4–7.0)
Neutrophils: 81 %
Platelets: 338 10*3/uL (ref 150–450)
RBC: 4.99 x10E6/uL (ref 4.14–5.80)
RDW: 16.1 % — ABNORMAL HIGH (ref 11.6–15.4)
WBC: 10.3 10*3/uL (ref 3.4–10.8)

## 2020-08-02 LAB — COMPREHENSIVE METABOLIC PANEL
ALT: 43 IU/L (ref 0–44)
AST: 31 IU/L (ref 0–40)
Albumin/Globulin Ratio: 1.4 (ref 1.2–2.2)
Albumin: 4 g/dL (ref 3.8–4.8)
Alkaline Phosphatase: 60 IU/L (ref 44–121)
BUN/Creatinine Ratio: 20 (ref 10–24)
BUN: 18 mg/dL (ref 8–27)
Bilirubin Total: 0.7 mg/dL (ref 0.0–1.2)
CO2: 27 mmol/L (ref 20–29)
Calcium: 9.5 mg/dL (ref 8.6–10.2)
Chloride: 99 mmol/L (ref 96–106)
Creatinine, Ser: 0.89 mg/dL (ref 0.76–1.27)
GFR calc Af Amer: 101 mL/min/{1.73_m2} (ref >59)
GFR calc non Af Amer: 87 mL/min/{1.73_m2} (ref >59)
Globulin, Total: 2.9 g/dL (ref 1.5–4.5)
Glucose: 86 mg/dL (ref 65–99)
Potassium: 4.5 mmol/L (ref 3.5–5.2)
Sodium: 139 mmol/L (ref 134–144)
Total Protein: 6.9 g/dL (ref 6.0–8.5)

## 2020-08-02 LAB — HEMOGLOBIN A1C
Est. average glucose Bld gHb Est-mCnc: 123 mg/dL
Hgb A1c MFr Bld: 5.9 % — ABNORMAL HIGH (ref 4.8–5.6)

## 2020-08-02 LAB — VITAMIN D 25 HYDROXY (VIT D DEFICIENCY, FRACTURES): Vit D, 25-Hydroxy: 20 ng/mL — ABNORMAL LOW (ref 30.0–100.0)

## 2020-08-02 LAB — TRANSFERRIN: Transferrin: 198 mg/dL (ref 177–329)

## 2020-08-06 NOTE — Telephone Encounter (Signed)
Last OV with Dr Opalski 

## 2020-08-06 NOTE — Telephone Encounter (Signed)
Dr Opalski, please advise  

## 2020-08-07 ENCOUNTER — Encounter (INDEPENDENT_AMBULATORY_CARE_PROVIDER_SITE_OTHER): Payer: Self-pay | Admitting: Family Medicine

## 2020-08-22 ENCOUNTER — Other Ambulatory Visit: Payer: Self-pay

## 2020-08-22 ENCOUNTER — Encounter (INDEPENDENT_AMBULATORY_CARE_PROVIDER_SITE_OTHER): Payer: Self-pay | Admitting: Family Medicine

## 2020-08-22 ENCOUNTER — Ambulatory Visit (INDEPENDENT_AMBULATORY_CARE_PROVIDER_SITE_OTHER): Payer: Medicare Other | Admitting: Family Medicine

## 2020-08-22 VITALS — BP 132/74 | HR 85 | Temp 97.8°F | Ht 68.0 in | Wt 270.0 lb

## 2020-08-22 DIAGNOSIS — Z6841 Body Mass Index (BMI) 40.0 and over, adult: Secondary | ICD-10-CM | POA: Diagnosis not present

## 2020-08-22 DIAGNOSIS — E1169 Type 2 diabetes mellitus with other specified complication: Secondary | ICD-10-CM

## 2020-08-22 DIAGNOSIS — Z9189 Other specified personal risk factors, not elsewhere classified: Secondary | ICD-10-CM | POA: Diagnosis not present

## 2020-08-22 MED ORDER — RYBELSUS 7 MG PO TABS: ORAL_TABLET | ORAL | 0 refills | Status: DC

## 2020-08-26 ENCOUNTER — Encounter (INDEPENDENT_AMBULATORY_CARE_PROVIDER_SITE_OTHER): Payer: Self-pay | Admitting: Family Medicine

## 2020-08-26 DIAGNOSIS — E1169 Type 2 diabetes mellitus with other specified complication: Secondary | ICD-10-CM

## 2020-08-26 DIAGNOSIS — E785 Hyperlipidemia, unspecified: Secondary | ICD-10-CM

## 2020-08-26 NOTE — Progress Notes (Signed)
Chief Complaint:   OBESITY Bryan Wang is here to discuss his progress with his obesity treatment plan along with follow-up of his obesity related diagnoses. Bryan Wang is on the Category 2 Plan and states he is following his eating plan approximately 30-50% of the time. Bryan Wang states he is exercising 0 minutes 0 times per week.  Today's visit was #: 10 Starting weight: 299 lbs Starting date: 04/03/2020 Today's weight: 270 lbs Today's date: 08/22/2020 Total lbs lost to date: 29 lbs Total lbs lost since last in-office visit: +7 lbs Total weight loss percentage to date: -9.70%  Interim History: Bryan Wang reports that he has been off the plan for the past several weeks. He knows he needs to get on it because of his knee surgery, etc. Bryan Wang plans on buying foods in the near future and getting rid of unhealthy foods in his house.   Assessment/Plan:   1. Type 2 diabetes mellitus with other specified complication, without long-term current use of insulin (HCC) Bryan Wang would like to discontinue glipizide and try a diabetes medication that promotes weight loss more so than current ones.  Plan: Discontinue Alogliptin and glipizide and start Rybelsus at 7 mg daily. Continue Metformin. Extensive discussion with patient about the risks and benefits of medication. No contraindications identified and patient wishes to try a different one. He will communicate this with his PCP.  2. At risk for hypoglycemia Bryan Wang was given approximately 12 minutes of counseling today regarding prevention of hypoglycemia. He was advised of symptoms of hypoglycemia. Bryan Wang was instructed to avoid skipping meals, eat regular protein rich meals and schedule low calorie snacks as needed.   3. Class 3 severe obesity with serious comorbidity and body mass index (BMI) of 40.0 to 44.9 in adult, unspecified obesity type Bryan Wang) Bryan Wang is currently in the action stage of change. As such, his goal is to continue with weight loss efforts. He  has agreed to the Category 2 Plan.   Exercise goals: SOME better than Nothing. Older adults should follow the adult guidelines. When older adults cannot meet the adult guidelines, they should be as physically active as their abilities and conditions will allow.   Behavioral modification strategies: increasing lean protein intake, decreasing simple carbohydrates, increasing water intake, meal planning and cooking strategies, keeping healthy foods in the home and planning for success.  Bryan Wang has agreed to follow-up with our clinic in 2 weeks. He was informed of the importance of frequent follow-up visits to maximize his success with intensive lifestyle modifications for his multiple health conditions.   Objective:   Blood pressure 132/74, pulse 85, temperature 97.8 F (36.6 C), height 5\' 8"  (1.727 m), weight 270 lb (122.5 kg), SpO2 100 %. Body mass index is 41.05 kg/m.  General: Cooperative, alert, well developed, in no acute distress. HEENT: Conjunctivae and lids unremarkable. Cardiovascular: Regular rhythm.  Lungs: Normal work of breathing. Neurologic: No focal deficits.   Lab Results  Component Value Date   CREATININE 0.89 08/01/2020   BUN 18 08/01/2020   NA 139 08/01/2020   K 4.5 08/01/2020   CL 99 08/01/2020   CO2 27 08/01/2020   Lab Results  Component Value Date   ALT 43 08/01/2020   AST 31 08/01/2020   ALKPHOS 60 08/01/2020   BILITOT 0.7 08/01/2020   Lab Results  Component Value Date   HGBA1C 5.9 (H) 08/01/2020   HGBA1C 7.4 05/03/2020   HGBA1C 7.4 05/03/2020   HGBA1C 8.5 (H) 04/03/2020   Lab Results  Component  Value Date   INSULIN 50.7 (H) 04/03/2020   Lab Results  Component Value Date   TSH 3.860 04/03/2020   Lab Results  Component Value Date   CHOL 110 08/01/2020   HDL 41 08/01/2020   LDLCALC 51 08/01/2020   TRIG 92 08/01/2020   CHOLHDL 2.7 08/01/2020   Lab Results  Component Value Date   WBC 10.3 08/01/2020   HGB 14.4 08/01/2020   HCT 44.4  08/01/2020   MCV 89 08/01/2020   PLT 338 08/01/2020   Lab Results  Component Value Date   IRON 51 08/01/2020   TIBC 253 08/01/2020   FERRITIN 147 08/01/2020    Attestation Statements:   Reviewed by clinician on day of visit: allergies, medications, problem list, medical history, surgical history, family history, social history, and previous encounter notes.  Coral Ceo, am acting as Location manager for [employer], DO.  I have reviewed the above documentation for accuracy and completeness, and I agree with the above. Marjory Sneddon, D.O.  The [place] was signed into law in 2016 which includes the topic of electronic health records.  This provides immediate access to information in MyChart.  This includes consultation notes, operative notes, office notes, lab results and pathology reports.  If you have any questions about what you read please let us know at your next visit so we can discuss your concerns and take corrective action if need be.  We are right here with you.

## 2020-08-27 ENCOUNTER — Encounter: Payer: Self-pay | Admitting: Orthopaedic Surgery

## 2020-08-27 NOTE — Telephone Encounter (Signed)
Last seen by Dr. Opalski. 

## 2020-08-28 NOTE — Telephone Encounter (Signed)
Decline RF. Let pt know that this is a medicine that pt should be getting RF's from his PCP.   We MAY HAVE refilled it in the past as a courtesy to help him out, but mgt of his cholesterol should be per PCP or specialist. thnx

## 2020-08-28 NOTE — Telephone Encounter (Signed)
Dr Raliegh Scarlet, please refill or deny, thank you.  Bryan Wang

## 2020-09-05 ENCOUNTER — Ambulatory Visit (INDEPENDENT_AMBULATORY_CARE_PROVIDER_SITE_OTHER): Payer: Medicare Other | Admitting: Family Medicine

## 2020-09-11 ENCOUNTER — Other Ambulatory Visit: Payer: Self-pay | Admitting: Orthopaedic Surgery

## 2020-09-11 ENCOUNTER — Ambulatory Visit (INDEPENDENT_AMBULATORY_CARE_PROVIDER_SITE_OTHER): Payer: Medicare Other | Admitting: Family Medicine

## 2020-09-11 NOTE — Telephone Encounter (Signed)
Please advise 

## 2020-09-12 ENCOUNTER — Ambulatory Visit (INDEPENDENT_AMBULATORY_CARE_PROVIDER_SITE_OTHER): Payer: Medicare Other | Admitting: Family Medicine

## 2020-09-12 ENCOUNTER — Encounter (INDEPENDENT_AMBULATORY_CARE_PROVIDER_SITE_OTHER): Payer: Self-pay | Admitting: Family Medicine

## 2020-09-12 ENCOUNTER — Other Ambulatory Visit: Payer: Self-pay

## 2020-09-12 VITALS — BP 134/67 | HR 70 | Temp 97.8°F | Ht 68.0 in | Wt 267.0 lb

## 2020-09-12 DIAGNOSIS — E785 Hyperlipidemia, unspecified: Secondary | ICD-10-CM | POA: Diagnosis not present

## 2020-09-12 DIAGNOSIS — Z6841 Body Mass Index (BMI) 40.0 and over, adult: Secondary | ICD-10-CM

## 2020-09-12 DIAGNOSIS — E1169 Type 2 diabetes mellitus with other specified complication: Secondary | ICD-10-CM

## 2020-09-12 MED ORDER — ATORVASTATIN CALCIUM 40 MG PO TABS: 40.0000 mg | ORAL_TABLET | Freq: Every day | ORAL | 0 refills | Status: DC

## 2020-09-13 NOTE — Progress Notes (Signed)
Chief Complaint:   OBESITY Bryan Wang is here to discuss his progress with his obesity treatment plan along with follow-up of his obesity related diagnoses. Kyrese is on the Category 2 Plan and states he is following his eating plan approximately 50% of the time. Thayden states he is walking around the yard 10 minutes 7 times per week.  Today's visit was #: 11 Starting weight: 299 lbs Starting date: 04/03/2020 Today's weight: 267 lbs Today's date: 09/12/2020 Total lbs lost to date: 32 lbs Total lbs lost since last in-office visit: 3 lbs Total weight loss percentage to date: -10.70%  Interim History: Pt is doing well but his left knee has been acting up and causing a lot of discomfort. He is treated with tramadol by orthopedist, Dr. Ninfa Linden. His surgery is pushed out because of the "elective" nature of it.  Plan: Pt has no issues with meal plan and knows he needs to follow more closely to see results he desires.  Assessment/Plan:   1. Type 2 diabetes mellitus with other specified complication, without long-term current use of insulin (HCC) We started pt on Rybelsus last OV. His insurance would not cover and pt cannot afford $900.00 a month. He reports fasting blood sugars 70-100. No lows or highs and no issues with treatment plan.   Lab Results  Component Value Date   HGBA1C 5.9 (H) 08/01/2020   HGBA1C 7.4 05/03/2020   HGBA1C 7.4 05/03/2020   Lab Results  Component Value Date   LDLCALC 51 08/01/2020   CREATININE 0.89 08/01/2020   Lab Results  Component Value Date   INSULIN 50.7 (H) 04/03/2020   Plan: Discontinue Rybelsus secondary to cost. Continue Glipizide 5 mg twice a day and Alogliptin 25 mg daily per PCP at the [place]. Continue home blood sugar monitoring, prudent nutritional plan, and weight loss.  2. Hyperlipidemia associated with type 2 diabetes mellitus ([place]) Due to insurance costs, pt requests that I refill his statin because it is cheaper to get at the pharmacy than  from his PCP through the [place].He wants a 90 days supply.   Lab Results  Component Value Date   ALT 43 08/01/2020   AST 31 08/01/2020   ALKPHOS 60 08/01/2020   BILITOT 0.7 08/01/2020   Lab Results  Component Value Date   CHOL 110 08/01/2020   HDL 41 08/01/2020   LDLCALC 51 08/01/2020   TRIG 92 08/01/2020   CHOLHDL 2.7 08/01/2020   Plan: Refill Lipitor for 3 month, as per below. Cardiovascular risk and specific lipid/LDL goals reviewed.  We discussed several lifestyle modifications today and Bryan Wang will continue to work on diet, exercise and weight loss efforts. Orders and follow up as documented in patient record.   Counseling Intensive lifestyle modifications are the first line treatment for this issue. . Dietary changes: Increase soluble fiber. Decrease simple carbohydrates. . Exercise changes: Moderate to vigorous-intensity aerobic activity 150 minutes per week if tolerated. . Lipid-lowering medications: see documented in medical record.   Refill- atorvastatin (LIPITOR) 40 MG tablet; Take 1 tablet (40 mg total) by mouth at bedtime.  Dispense: 90 tablet; Refill: 0  3. Class 3 severe obesity with serious comorbidity and body mass index (BMI) of 40.0 to 44.9 in adult, unspecified obesity type [facility]) Yona is currently in the action stage of change. As such, his goal is to continue with weight loss efforts. He has agreed to the Category 2 Plan.   Exercise goals: Exercise as tolerated and directed by orthopedics  Behavioral modification strategies: increasing water intake, ways to avoid boredom eating, ways to avoid night time snacking, better snacking choices and avoiding temptations.  Bryan Wang has agreed to follow-up with our clinic in 2-3 weeks. He was informed of the importance of frequent follow-up visits to maximize his success with intensive lifestyle modifications for his multiple health conditions.   Objective:   Blood pressure 134/67, pulse 70, temperature 97.8 F (36.6 C),  height 5\' 8"  (1.727 m), weight 267 lb (121.1 kg), SpO2 98 %. Body mass index is 40.6 kg/m.  General: Cooperative, alert, well developed, in no acute distress. HEENT: Conjunctivae and lids unremarkable. Cardiovascular: Regular rhythm.  Lungs: Normal work of breathing. Neurologic: No focal deficits.   Lab Results  Component Value Date   CREATININE 0.89 08/01/2020   BUN 18 08/01/2020   NA 139 08/01/2020   K 4.5 08/01/2020   CL 99 08/01/2020   CO2 27 08/01/2020   Lab Results  Component Value Date   ALT 43 08/01/2020   AST 31 08/01/2020   ALKPHOS 60 08/01/2020   BILITOT 0.7 08/01/2020   Lab Results  Component Value Date   HGBA1C 5.9 (H) 08/01/2020   HGBA1C 7.4 05/03/2020   HGBA1C 7.4 05/03/2020   HGBA1C 8.5 (H) 04/03/2020   Lab Results  Component Value Date   INSULIN 50.7 (H) 04/03/2020   Lab Results  Component Value Date   TSH 3.860 04/03/2020   Lab Results  Component Value Date   CHOL 110 08/01/2020   HDL 41 08/01/2020   LDLCALC 51 08/01/2020   TRIG 92 08/01/2020   CHOLHDL 2.7 08/01/2020   Lab Results  Component Value Date   WBC 10.3 08/01/2020   HGB 14.4 08/01/2020   HCT 44.4 08/01/2020   MCV 89 08/01/2020   PLT 338 08/01/2020   Lab Results  Component Value Date   IRON 51 08/01/2020   TIBC 253 08/01/2020   FERRITIN 147 08/01/2020    Obesity Behavioral Intervention:   Approximately 15 minutes were spent on the discussion below.  ASK: We discussed the diagnosis of obesity with [name] today and Tamel agreed to give [name] permission to discuss obesity behavioral modification therapy today.  ASSESS: Coda has the diagnosis of obesity and his BMI today is 40.6. [place] is in the action stage of change.   ADVISE: Mccauley was educated on the multiple health risks of obesity as well as the benefit of weight loss to improve his health. He was advised of the need for long term treatment and the importance of lifestyle modifications to improve his current  health and to decrease his risk of future health problems.  AGREE: Multiple dietary modification options and treatment options were discussed and Rodrick agreed to follow the recommendations documented in the above note.  ARRANGE: Thang was educated on the importance of frequent visits to treat obesity as outlined per CMS and USPSTF guidelines and agreed to schedule his next follow up appointment today.  Attestation Statements:   Reviewed by clinician on day of visit: allergies, medications, problem list, medical history, surgical history, family history, social history, and previous encounter notes.  Coral Ceo, am acting as Location manager for [employer], DO.  I have reviewed the above documentation for accuracy and completeness, and I agree with the above. Marjory Sneddon, D.O.  The [place] was signed into law in 2016 which includes the topic of electronic health records.  This provides immediate access to information in MyChart.  This includes consultation notes, operative notes, office notes, lab results and pathology reports.  If you have any questions about what you read please let us know at your next visit so we can discuss your concerns and take corrective action if need be.  We are right here with you.

## 2020-09-24 ENCOUNTER — Encounter (INDEPENDENT_AMBULATORY_CARE_PROVIDER_SITE_OTHER): Payer: Self-pay | Admitting: Family Medicine

## 2020-09-25 ENCOUNTER — Other Ambulatory Visit: Payer: Self-pay

## 2020-09-25 ENCOUNTER — Ambulatory Visit (INDEPENDENT_AMBULATORY_CARE_PROVIDER_SITE_OTHER): Payer: Medicare Other | Admitting: Family Medicine

## 2020-09-25 ENCOUNTER — Encounter (INDEPENDENT_AMBULATORY_CARE_PROVIDER_SITE_OTHER): Payer: Self-pay | Admitting: Family Medicine

## 2020-09-25 VITALS — BP 122/67 | HR 60 | Temp 97.9°F | Ht 68.0 in | Wt 269.0 lb

## 2020-09-25 DIAGNOSIS — Z6841 Body Mass Index (BMI) 40.0 and over, adult: Secondary | ICD-10-CM | POA: Diagnosis not present

## 2020-09-25 DIAGNOSIS — E1169 Type 2 diabetes mellitus with other specified complication: Secondary | ICD-10-CM | POA: Diagnosis not present

## 2020-09-25 MED ORDER — GLIPIZIDE 5 MG PO TABS: 2.5000 mg | ORAL_TABLET | Freq: Two times a day (BID) | ORAL | Status: AC

## 2020-09-27 NOTE — Progress Notes (Signed)
Chief Complaint:   OBESITY Bryan Wang is here to discuss his progress with his obesity treatment plan along with follow-up of his obesity related diagnoses.   Today's visit was #: 12 Starting weight: 299 lbs Starting date: 04/03/2020 Today's weight: 269 lbs Today's date: 09/25/2020 Total lbs lost to date: 30 lbs Body mass index is 40.9 kg/m.  Total weight loss percentage to date: -10.03%  Interim History: Bryan Wang is still up 6 pounds since early December 2021 (before Christmas).  Bryan Wang is here for a follow up office visit.  he is following the meal plan without concern or issues.  Patient's meal and food recall appears to be accurate and consistent with what is on the plan.  When on plan, his hunger and cravings are well controlled.    Plan:  His birthday is tomorrow and he has friends and family in from out of town.  We discussed strategies to not gain over the next week or so.  Nutrition Plan: Category 2 for 70% of the time. Activity: Yard work / house work for 45 minutes 5 days per week.  Assessment/Plan:   Medications Discontinued During This Encounter  Medication Reason  . glipiZIDE (GLUCOTROL) 5 MG tablet      Meds ordered this encounter  Medications  . glipiZIDE (GLUCOTROL) 5 MG tablet    Sig: Take 0.5 tablets (2.5 mg total) by mouth 2 (two) times daily before a meal.     1. Type 2 diabetes mellitus with other specified complication, without long-term current use of insulin (HCC) Diabetes Mellitus: Not at goal. Medication: metformin 1000 mg twice daily, glipizide 5 mg twice daily, alogliptin 25 mg daily.  FBS 70s, never over 100.   Issues reviewed: blood sugar goals, complications of diabetes mellitus, hypoglycemia prevention and treatment, exercise, and nutrition.   Plan:  The GLP-1s that we tried changing him to will not be covered by his insurance.  All future diabetes [facility].  Change glipizide dose from 5 mg twice daily to 2.5 mg twice  daily.  Lab Results  Component Value Date   HGBA1C 5.9 (H) 08/01/2020   HGBA1C 7.4 05/03/2020   HGBA1C 7.4 05/03/2020   Lab Results  Component Value Date   LDLCALC 51 08/01/2020   CREATININE 0.89 08/01/2020   - Refill and decrease glipiZIDE (GLUCOTROL) 5 MG tablet; Take 0.5 tablets (2.5 mg total) by mouth 2 (two) times daily before a meal.  2. Class 3 severe obesity with serious comorbidity and body mass index (BMI) of 40.0 to 44.9 in adult, unspecified obesity type ([place])  Course: Bryan Wang is currently in the action stage of change. As such, his goal is to continue with weight loss efforts.   Nutrition goals: He has agreed to the Category 2 Plan.   Exercise goals: As is.  Behavioral modification strategies: meal planning and cooking strategies, keeping healthy foods in the home and celebration eating strategies.  Nora has agreed to follow-up with our clinic in 2 weeks. He was informed of the importance of frequent follow-up visits to maximize his success with intensive lifestyle modifications for his multiple health conditions.   Objective:   Blood pressure 122/67, pulse 60, temperature 97.9 F (36.6 C), height 5\' 8"  (1.727 m), weight 269 lb (122 kg), SpO2 98 %. Body mass index is 40.9 kg/m.  General: Cooperative, alert, well developed, in no acute distress. HEENT: Conjunctivae and lids unremarkable. Cardiovascular: Regular rhythm.  Lungs: Normal work of breathing. Neurologic: No  focal deficits.   Lab Results  Component Value Date   CREATININE 0.89 08/01/2020   BUN 18 08/01/2020   NA 139 08/01/2020   K 4.5 08/01/2020   CL 99 08/01/2020   CO2 27 08/01/2020   Lab Results  Component Value Date   ALT 43 08/01/2020   AST 31 08/01/2020   ALKPHOS 60 08/01/2020   BILITOT 0.7 08/01/2020   Lab Results  Component Value Date   HGBA1C 5.9 (H) 08/01/2020   HGBA1C 7.4 05/03/2020   HGBA1C 7.4 05/03/2020   HGBA1C 8.5 (H) 04/03/2020   Lab Results  Component Value Date    INSULIN 50.7 (H) 04/03/2020   Lab Results  Component Value Date   TSH 3.860 04/03/2020   Lab Results  Component Value Date   CHOL 110 08/01/2020   HDL 41 08/01/2020   LDLCALC 51 08/01/2020   TRIG 92 08/01/2020   CHOLHDL 2.7 08/01/2020   Lab Results  Component Value Date   WBC 10.3 08/01/2020   HGB 14.4 08/01/2020   HCT 44.4 08/01/2020   MCV 89 08/01/2020   PLT 338 08/01/2020   Lab Results  Component Value Date   IRON 51 08/01/2020   TIBC 253 08/01/2020   FERRITIN 147 08/01/2020    Obesity Behavioral Intervention:   Approximately 15 minutes were spent on the discussion below.  ASK: We discussed the diagnosis of obesity with Bryan Wang today and Bryan Wang agreed to give Bryan Wang permission to discuss obesity behavioral modification therapy today.  ASSESS: Bryan Wang has the diagnosis of obesity and his BMI today is 40.9. Bryan Wang is in the action stage of change.   ADVISE: [name] was educated on the multiple health risks of obesity as well as the benefit of weight loss to improve his health. He was advised of the need for long term treatment and the importance of lifestyle modifications to improve his current health and to decrease his risk of future health problems.  AGREE: Multiple dietary modification options and treatment options were discussed and Bryan Wang agreed to follow the recommendations documented in the above note.  ARRANGE: Bryan Wang was educated on the importance of frequent visits to treat obesity as outlined per CMS and USPSTF guidelines and agreed to schedule his next follow up appointment today.  Attestation Statements:   Reviewed by clinician on day of visit: allergies, medications, problem list, medical history, surgical history, family history, social history, and previous encounter notes.  I, Water quality scientist, CMA, am acting as Location manager for [employer], DO.  I have reviewed the above documentation for accuracy and completeness, and I agree with the above. Bryan Wang, D.O.  The [place] was signed into law in 2016 which includes the topic of electronic health records.  This provides immediate access to information in MyChart.  This includes consultation notes, operative notes, office notes, lab results and pathology reports.  If you have any questions about what you read please let Bryan Wang know at your next visit so we can discuss your concerns and take corrective action if need be.  We are right here with you.

## 2020-10-04 ENCOUNTER — Other Ambulatory Visit: Payer: Self-pay

## 2020-10-09 ENCOUNTER — Other Ambulatory Visit: Payer: Self-pay | Admitting: Physician Assistant

## 2020-10-09 ENCOUNTER — Telehealth: Payer: Self-pay

## 2020-10-09 NOTE — Telephone Encounter (Signed)
Please call. Thanks.

## 2020-10-09 NOTE — Telephone Encounter (Signed)
Let him know that since that is his left knee that is being replaced, he can drive as he is not taking a narcotic on a regular basis or does not have one in his system when he gets behind the wheel.

## 2020-10-09 NOTE — Telephone Encounter (Signed)
Pt called and informed and stated understanding  °

## 2020-10-09 NOTE — Telephone Encounter (Signed)
Please call patient.  He has questions about post op driving, etc.  He is scheduled for left TKA on 10/19/20.  Thanks!  [phone]

## 2020-10-11 NOTE — Patient Instructions (Addendum)
DUE TO COVID-19 ONLY ONE VISITOR IS ALLOWED TO COME WITH YOU AND STAY IN THE WAITING ROOM ONLY DURING PRE OP AND PROCEDURE DAY OF SURGERY. THE 1 VISITOR  MAY VISIT WITH YOU AFTER SURGERY IN YOUR PRIVATE ROOM DURING VISITING HOURS ONLY!  YOU NEED TO HAVE A COVID 19 TEST ON: 10/16/20 @ 9:00 AM  , THIS TEST MUST BE DONE BEFORE SURGERY,  COVID TESTING SITE [place] [address], IT IS ON THE RIGHT GOING OUT [address] APPROXIMATELY  2 MINUTES PAST [employer] ON THE RIGHT. ONCE YOUR COVID TEST IS COMPLETED,  PLEASE BEGIN THE QUARANTINE INSTRUCTIONS AS OUTLINED IN YOUR HANDOUT.                [name]   Your procedure is scheduled on: 10/19/20   Report to [facility] Main  Entrance   Report to admitting at: 11:00 AM     Call this number if you have problems the morning of surgery [phone]    Remember: Do not eat solid food :After Midnight. Clear liquids until: 10:30 am.  CLEAR LIQUID DIET  Foods Allowed                                                                     Foods Excluded  Coffee and tea, regular and decaf                             liquids that you cannot  Plain Jell-O any favor except red or purple                                           see through such as: Fruit ices (not with fruit pulp)                                     milk, soups, orange juice  Iced Popsicles                                    All solid food Carbonated beverages, regular and diet                                    Cranberry, grape and apple juices Sports drinks like Gatorade Lightly seasoned clear broth or consume(fat free) Sugar, honey syrup  Sample Menu Breakfast                                Lunch                                     Supper Cranberry juice  Beef broth                            Chicken broth Jell-O                                     Grape juice                           Apple juice Coffee or tea                         Jell-O                                      Popsicle                                                Coffee or tea                        Coffee or tea  _____________________________________________________________________  BRUSH YOUR TEETH MORNING OF SURGERY AND RINSE YOUR MOUTH OUT, NO CHEWING GUM CANDY OR MINTS.    Take these medicines the morning of surgery with A SIP OF WATER: azathioprine,flomax,budesonide.Use eye drops as usual.  How to Manage Your Diabetes Before and After Surgery  Why is it important to control my blood sugar before and after surgery? . Improving blood sugar levels before and after surgery helps healing and can limit problems. . A way of improving blood sugar control is eating a healthy diet by: o  Eating less sugar and carbohydrates o  Increasing activity/exercise o  Talking with your doctor about reaching your blood sugar goals . High blood sugars (greater than 180 mg/dL) can raise your risk of infections and slow your recovery, so you will need to focus on controlling your diabetes during the weeks before surgery. . Make sure that the doctor who takes care of your diabetes knows about your planned surgery including the date and location.  How do I manage my blood sugar before surgery? . Check your blood sugar at least 4 times a day, starting 2 days before surgery, to make sure that the level is not too high or low. o Check your blood sugar the morning of your surgery when you wake up and every 2 hours until you get to the Short Stay unit. . If your blood sugar is less than 70 mg/dL, you will need to treat for low blood sugar: o Do not take insulin. o Treat a low blood sugar (less than 70 mg/dL) with  cup of clear juice (cranberry or apple), 4 glucose tablets, OR glucose gel. o Recheck blood sugar in 15 minutes after treatment (to make sure it is greater than 70 mg/dL). If your blood sugar is not greater than 70 mg/dL on recheck, call  [phone] for further instructions. . Report your blood sugar to the short stay nurse when you get to Short Stay.  . If you are admitted to the hospital after surgery: o Your blood sugar will be checked by the staff and you will  probably be given insulin after surgery (instead of oral diabetes medicines) to make sure you have good blood sugar levels. o The goal for blood sugar control after surgery is 80-180 mg/dL.   WHAT DO I DO ABOUT MY DIABETES MEDICATION?  Marland Kitchen Do not take oral diabetes medicines (pills) the morning of surgery.  . THE DAY BEFORE SURGERY, take glipizide,metformin and alogliptin as usual.      THE MORNING OF SURGERY, DO NOT TAKE ANY DIABETIC MEDICATIONS DAY OF YOUR SURGERY                               You may not have any metal on your body including hair pins and              piercings  Do not wear jewelry, lotions, powders or perfumes, deodorant             Men may shave face and neck.   Do not bring valuables to the hospital. [place].  Contacts, dentures or bridgework may not be worn into surgery.  Leave suitcase in the car. After surgery it may be brought to your room.     Patients discharged the day of surgery will not be allowed to drive home. IF YOU ARE HAVING SURGERY AND GOING HOME THE SAME DAY, YOU MUST HAVE AN ADULT TO DRIVE YOU HOME AND BE WITH YOU FOR 24 HOURS. YOU MAY GO HOME BY TAXI OR UBER OR ORTHERWISE, BUT AN ADULT MUST ACCOMPANY YOU HOME AND STAY WITH YOU FOR 24 HOURS.  Name and phone number of your driver:  Special Instructions: N/A              Please read over the following fact sheets you were given: _____________________________________________________________________          PLEASE BRING CPAP MASK [place]. DEVICE WILL BE PROVIDED!  [facility] - Preparing for Surgery Before surgery, you can play an important role.  Because skin is not sterile, your skin needs to be as free of  germs as possible.  You can reduce the number of germs on your skin by washing with CHG (chlorahexidine gluconate) soap before surgery.  CHG is an antiseptic cleaner which kills germs and bonds with the skin to continue killing germs even after washing. Please DO NOT use if you have an allergy to CHG or antibacterial soaps.  If your skin becomes reddened/irritated stop using the CHG and inform your nurse when you arrive at Short Stay. Do not shave (including legs and underarms) for at least 48 hours prior to the first CHG shower.  You may shave your face/neck. Please follow these instructions carefully:  1.  Shower with CHG Soap the night before surgery and the  morning of Surgery.  2.  If you choose to wash your hair, wash your hair first as usual with your  normal  shampoo.  3.  After you shampoo, rinse your hair and body thoroughly to remove the  shampoo.                           4.  Use CHG as you would any other liquid soap.  You can apply chg directly  to the skin and wash  Gently with a scrungie or clean washcloth.  5.  Apply the CHG Soap to your body ONLY FROM THE NECK DOWN.   Do not use on face/ open                           Wound or open sores. Avoid contact with eyes, ears mouth and genitals (private parts).                       Wash face,  Genitals (private parts) with your normal soap.             6.  Wash thoroughly, paying special attention to the area where your surgery  will be performed.  7.  Thoroughly rinse your body with warm water from the neck down.  8.  DO NOT shower/wash with your normal soap after using and rinsing off  the CHG Soap.                9.  Pat yourself dry with a clean towel.            10.  Wear clean pajamas.            11.  Place clean sheets on your bed the night of your first shower and do not  sleep with pets. Day of Surgery : Do not apply any lotions/deodorants the morning of surgery.  Please wear clean clothes to the  hospital/surgery center.  FAILURE TO FOLLOW THESE INSTRUCTIONS MAY RESULT IN THE CANCELLATION OF YOUR SURGERY PATIENT SIGNATURE_________________________________  NURSE SIGNATURE__________________________________  ________________________________________________________________________   Adam Phenix  An incentive spirometer is a tool that can help keep your lungs clear and active. This tool measures how well you are filling your lungs with each breath. Taking long deep breaths may help reverse or decrease the chance of developing breathing (pulmonary) problems (especially infection) following:  A long period of time when you are unable to move or be active. BEFORE THE PROCEDURE   If the spirometer includes an indicator to show your best effort, your nurse or respiratory therapist will set it to a desired goal.  If possible, sit up straight or lean slightly forward. Try not to slouch.  Hold the incentive spirometer in an upright position. INSTRUCTIONS FOR USE  1. Sit on the edge of your bed if possible, or sit up as far as you can in bed or on a chair. 2. Hold the incentive spirometer in an upright position. 3. Breathe out normally. 4. Place the mouthpiece in your mouth and seal your lips tightly around it. 5. Breathe in slowly and as deeply as possible, raising the piston or the ball toward the top of the column. 6. Hold your breath for 3-5 seconds or for as long as possible. Allow the piston or ball to fall to the bottom of the column. 7. Remove the mouthpiece from your mouth and breathe out normally. 8. Rest for a few seconds and repeat Steps 1 through 7 at least 10 times every 1-2 hours when you are awake. Take your time and take a few normal breaths between deep breaths. 9. The spirometer may include an indicator to show your best effort. Use the indicator as a goal to work toward during each repetition. 10. After each set of 10 deep breaths, practice coughing to be sure  your lungs are clear. If you have an incision (the cut made at the time of  surgery), support your incision when coughing by placing a pillow or rolled up towels firmly against it. Once you are able to get out of bed, walk around indoors and cough well. You may stop using the incentive spirometer when instructed by your caregiver.  RISKS AND COMPLICATIONS  Take your time so you do not get dizzy or light-headed.  If you are in pain, you may need to take or ask for pain medication before doing incentive spirometry. It is harder to take a deep breath if you are having pain. AFTER USE  Rest and breathe slowly and easily.  It can be helpful to keep track of a log of your progress. Your caregiver can provide you with a simple table to help with this. If you are using the spirometer at home, follow these instructions: [place] IF:   You are having difficultly using the spirometer.  You have trouble using the spirometer as often as instructed.  Your pain medication is not giving enough relief while using the spirometer.  You develop fever of 100.5 F (38.1 C) or higher. SEEK IMMEDIATE MEDICAL CARE IF:   You cough up bloody sputum that had not been present before.  You develop fever of 102 F (38.9 C) or greater.  You develop worsening pain at or near the incision site. MAKE SURE YOU:   Understand these instructions.  Will watch your condition.  Will get help right away if you are not doing well or get worse. Document Released: 12/15/2006 Document Revised: 10/27/2011 Document Reviewed: 02/15/2007 [facility] Patient Information 2014 [place], [place].   ________________________________________________________________________

## 2020-10-12 ENCOUNTER — Encounter (HOSPITAL_COMMUNITY): Payer: Self-pay

## 2020-10-12 ENCOUNTER — Encounter (HOSPITAL_COMMUNITY)
Admission: RE | Admit: 2020-10-12 | Discharge: 2020-10-12 | Disposition: A | Discharge status: Home / Self Care | Payer: No Typology Code available for payment source | Source: Ambulatory Visit | Attending: Orthopaedic Surgery | Admitting: Orthopaedic Surgery

## 2020-10-12 ENCOUNTER — Other Ambulatory Visit: Payer: Self-pay

## 2020-10-12 DIAGNOSIS — Z01812 Encounter for preprocedural laboratory examination: Secondary | ICD-10-CM | POA: Diagnosis not present

## 2020-10-12 HISTORY — DX: Essential (primary) hypertension: I10

## 2020-10-12 HISTORY — DX: Malignant (primary) neoplasm, unspecified: C80.1

## 2020-10-12 HISTORY — DX: Unspecified osteoarthritis, unspecified site: M19.90

## 2020-10-12 LAB — CBC
HCT: 47.1 % (ref 39.0–52.0)
Hemoglobin: 14.9 g/dL (ref 13.0–17.0)
MCH: 28.9 pg (ref 26.0–34.0)
MCHC: 31.6 g/dL (ref 30.0–36.0)
MCV: 91.5 fL (ref 80.0–100.0)
Platelets: 288 10*3/uL (ref 150–400)
RBC: 5.15 MIL/uL (ref 4.22–5.81)
RDW: 17 % — ABNORMAL HIGH (ref 11.5–15.5)
WBC: 8.7 10*3/uL (ref 4.0–10.5)
nRBC: 0 % (ref 0.0–0.2)

## 2020-10-12 LAB — BASIC METABOLIC PANEL
Anion gap: 9 (ref 5–15)
BUN: 25 mg/dL — ABNORMAL HIGH (ref 8–23)
CO2: 25 mmol/L (ref 22–32)
Calcium: 9.1 mg/dL (ref 8.9–10.3)
Chloride: 100 mmol/L (ref 98–111)
Creatinine, Ser: 0.88 mg/dL (ref 0.61–1.24)
GFR, Estimated: >60 mL/min (ref >60)
Glucose, Bld: 119 mg/dL — ABNORMAL HIGH (ref 70–99)
Potassium: 3.8 mmol/L (ref 3.5–5.1)
Sodium: 134 mmol/L — ABNORMAL LOW (ref 135–145)

## 2020-10-12 LAB — HEMOGLOBIN A1C
Hgb A1c MFr Bld: 6 % — ABNORMAL HIGH (ref 4.8–5.6)
Mean Plasma Glucose: 125.5 mg/dL

## 2020-10-12 LAB — GLUCOSE, CAPILLARY: Glucose-Capillary: 122 mg/dL — ABNORMAL HIGH (ref 70–99)

## 2020-10-12 LAB — SURGICAL PCR SCREEN
MRSA, PCR: NEGATIVE
Staphylococcus aureus: NEGATIVE

## 2020-10-12 NOTE — Progress Notes (Signed)
COVID Vaccine Completed: Yes Date COVID Vaccine completed: 05/03/20 [place] COVID vaccine manufacturer:  Moderna    PCP - Dr. Herold Harms Cardiologist -   Chest x-ray -  EKG - 04/03/20 Stress Test -  ECHO -  Cardiac Cath -  Pacemaker/ICD device last checked:  Sleep Study - Yes CPAP - Yes  Fasting Blood Sugar - 90's - 100's Checks Blood Sugar _3_ times a day  Blood Thinner Instructions: Aspirin Instructions: Last Dose:  Anesthesia review: DIA,HTN,OSA(CPAP)  Patient denies shortness of breath, fever, cough and chest pain at PAT appointment   Patient verbalized understanding of instructions that were given to them at the PAT appointment. Patient was also instructed that they will need to review over the PAT instructions again at home before surgery.

## 2020-10-15 ENCOUNTER — Other Ambulatory Visit: Payer: Self-pay

## 2020-10-15 ENCOUNTER — Encounter (INDEPENDENT_AMBULATORY_CARE_PROVIDER_SITE_OTHER): Payer: Self-pay | Admitting: Family Medicine

## 2020-10-15 ENCOUNTER — Ambulatory Visit (INDEPENDENT_AMBULATORY_CARE_PROVIDER_SITE_OTHER): Payer: Medicare Other | Admitting: Family Medicine

## 2020-10-15 VITALS — BP 127/67 | HR 69 | Temp 97.6°F | Ht 68.0 in | Wt 266.0 lb

## 2020-10-15 DIAGNOSIS — E1169 Type 2 diabetes mellitus with other specified complication: Secondary | ICD-10-CM | POA: Diagnosis not present

## 2020-10-15 DIAGNOSIS — Z9189 Other specified personal risk factors, not elsewhere classified: Secondary | ICD-10-CM

## 2020-10-15 DIAGNOSIS — Z6841 Body Mass Index (BMI) 40.0 and over, adult: Secondary | ICD-10-CM | POA: Diagnosis not present

## 2020-10-16 ENCOUNTER — Other Ambulatory Visit (HOSPITAL_COMMUNITY)
Admission: RE | Admit: 2020-10-16 | Discharge: 2020-10-16 | Disposition: A | Discharge status: Home / Self Care | Payer: Medicare Other | Source: Ambulatory Visit | Attending: Orthopaedic Surgery | Admitting: Orthopaedic Surgery

## 2020-10-16 DIAGNOSIS — Z20822 Contact with and (suspected) exposure to covid-19: Secondary | ICD-10-CM | POA: Insufficient documentation

## 2020-10-16 DIAGNOSIS — Z01812 Encounter for preprocedural laboratory examination: Secondary | ICD-10-CM | POA: Insufficient documentation

## 2020-10-16 LAB — SARS CORONAVIRUS 2 (TAT 6-24 HRS): SARS Coronavirus 2: NEGATIVE

## 2020-10-17 NOTE — Progress Notes (Signed)
Chief Complaint:   OBESITY Bryan Wang is here to discuss his progress with his obesity treatment plan along with follow-up of his obesity related diagnoses.   Today's visit was #: 13 Starting weight: 299 lbs Starting date: 04/03/2020 Today's weight: 266 lbs Today's date: 10/15/2020 Total lbs lost to date: 33 lbs Body mass index is 40.45 kg/m.  Total weight loss percentage to date: -11.04%  Interim History:  Bryan Wang is scheduled for knee replacement this Friday.  He had presurgical clearance recently with blood work.  I reviewed the results with him today.  Plan:  Recipes given to him in hopes his wife will cook for him over the next couple of weeks.  Current Meal Plan: the Category 2 Plan for 60% of the time.  Current Exercise Plan: Yard work/splitting wood for 30 minutes 5 times per week.  Assessment/Plan:   1. Type 2 diabetes mellitus with other specified complication, without long-term current use of insulin (HCC) Diabetes Mellitus: Improving, but not optimized. Medication: glipizide 2.5 mg daily, metformin 1,000 mg twice daily. Issues reviewed: blood sugar goals, complications of diabetes mellitus, hypoglycemia prevention and treatment, exercise, and nutrition.  A1c recently 6.0.  FBS checked 3 times weekly.  Lowest 75 and highest 111.  No symptoms or concerns.  Plan: The importance of regular follow up with PCP and all other specialists as scheduled was stressed to patient today.  Continue medications.  Will hold morning of surgery per surgical protocol they gave him.  Lab Results  Component Value Date   HGBA1C 6.0 (H) 10/12/2020   HGBA1C 5.9 (H) 08/01/2020   HGBA1C 7.4 05/03/2020   HGBA1C 7.4 05/03/2020   Lab Results  Component Value Date   LDLCALC 51 08/01/2020   CREATININE 0.88 10/12/2020   2. At risk for hypoglycemia Bryan Wang was given approximately 8 minutes of counseling today regarding prevention of hypoglycemia.  Discussed with him that he is at risk of his blood  sugars going up and/or down after surgery.  He was advised of symptoms of hypoglycemia.  Bryan Wang was instructed to avoid skipping meals and to eat regular protein-rich meals as prescribed on the meal plan.  The patient should have readily available low calorie snacks as needed, such as [employer] fruit snack packs, if blood sugar goes too low.   3. Class 3 severe obesity with serious comorbidity and body mass index (BMI) of 40.0 to 44.9 in adult, unspecified obesity type ([place])  Course: Bryan Wang is currently in the action stage of change. As such, his goal is to continue with weight loss efforts.   Nutrition goals: He has agreed to the Category 2 Plan.   Exercise goals: As is.  Behavioral modification strategies: meal planning and cooking strategies and planning for success.  Bryan Wang has agreed to follow-up with our clinic in 3-4 weeks due to upcoming surgery. He was informed of the importance of frequent follow-up visits to maximize his success with intensive lifestyle modifications for his multiple health conditions.   Objective:   Blood pressure 127/67, pulse 69, temperature 97.6 F (36.4 C), height 5\' 8"  (1.727 m), weight 266 lb (120.7 kg), SpO2 98 %. Body mass index is 40.45 kg/m.  General: Cooperative, alert, well developed, in no acute distress. HEENT: Conjunctivae and lids unremarkable. Cardiovascular: Regular rhythm.  Lungs: Normal work of breathing. Neurologic: No focal deficits.   Lab Results  Component Value Date   CREATININE 0.88 10/12/2020   BUN 25 (H) 10/12/2020   NA 134 (L) 10/12/2020  K 3.8 10/12/2020   CL 100 10/12/2020   CO2 25 10/12/2020   Lab Results  Component Value Date   ALT 43 08/01/2020   AST 31 08/01/2020   ALKPHOS 60 08/01/2020   BILITOT 0.7 08/01/2020   Lab Results  Component Value Date   HGBA1C 6.0 (H) 10/12/2020   HGBA1C 5.9 (H) 08/01/2020   HGBA1C 7.4 05/03/2020   HGBA1C 7.4 05/03/2020   HGBA1C 8.5 (H) 04/03/2020   Lab Results  Component  Value Date   INSULIN 50.7 (H) 04/03/2020   Lab Results  Component Value Date   TSH 3.860 04/03/2020   Lab Results  Component Value Date   CHOL 110 08/01/2020   HDL 41 08/01/2020   LDLCALC 51 08/01/2020   TRIG 92 08/01/2020   CHOLHDL 2.7 08/01/2020   Lab Results  Component Value Date   WBC 8.7 10/12/2020   HGB 14.9 10/12/2020   HCT 47.1 10/12/2020   MCV 91.5 10/12/2020   PLT 288 10/12/2020   Lab Results  Component Value Date   IRON 51 08/01/2020   TIBC 253 08/01/2020   FERRITIN 147 08/01/2020   Attestation Statements:   Reviewed by clinician on day of visit: allergies, medications, problem list, medical history, surgical history, family history, social history, and previous encounter notes.  I, Water quality scientist, CMA, am acting as Location manager for [employer], DO.  I have reviewed the above documentation for accuracy and completeness, and I agree with the above. Marjory Sneddon, D.O.  The [place] was signed into law in 2016 which includes the topic of electronic health records.  This provides immediate access to information in MyChart.  This includes consultation notes, operative notes, office notes, lab results and pathology reports.  If you have any questions about what you read please let us know at your next visit so we can discuss your concerns and take corrective action if need be.  We are right here with you.

## 2020-10-18 ENCOUNTER — Telehealth: Payer: Self-pay

## 2020-10-18 MED ORDER — DEXTROSE 5 % IV SOLN
3.0000 g | INTRAVENOUS | Status: AC
  Administered 2020-10-19: 3 g via INTRAVENOUS
  Filled 2020-10-18: qty 3

## 2020-10-18 NOTE — Progress Notes (Signed)
Pt aware to arrive at [facility] admitting dept at [place] on Friday 10/19/2020 for scheduled surgical procedure. No food after midnight; clear liquids from midnight till  0915 then nothing by mouth.

## 2020-10-18 NOTE — H&P (Signed)
TOTAL KNEE ADMISSION H&P  Patient is being admitted for left total knee arthroplasty.  Subjective:  Chief Complaint:left knee pain.  HPI: Bryan Wang, 70 y.o. male, has a history of pain and functional disability in the left knee due to arthritis and has failed non-surgical conservative treatments for greater than 12 weeks to includeNSAID's and/or analgesics, corticosteriod injections, flexibility and strengthening excercises, weight reduction as appropriate and activity modification.  Onset of symptoms was gradual, starting 5 years ago with gradually worsening course since that time. The patient noted prior procedures on the knee to include  arthroscopy on the left knee(s).  Patient currently rates pain in the left knee(s) at 10 out of 10 with activity. Patient has night pain, worsening of pain with activity and weight bearing, pain that interferes with activities of daily living, pain with passive range of motion, crepitus and joint swelling.  Patient has evidence of subchondral sclerosis, periarticular osteophytes and joint space narrowing by imaging studies. There is no active infection.  Patient Active Problem List   Diagnosis Date Noted   At risk for hypoglycemia 08/22/2020   At risk for constipation 08/01/2020   Unilateral primary osteoarthritis, left knee 07/26/2020   At risk for activity intolerance 07/18/2020   Other hyperlipidemia 06/20/2020   Hypertension associated with type 2 diabetes mellitus ([place]) 06/20/2020   Absolute anemia 06/20/2020   Diabetes mellitus ([place]) 06/04/2020   Hyperlipidemia associated with type 2 diabetes mellitus ([place]) 06/04/2020   Iron deficiency 06/04/2020   Past Medical History:  Diagnosis Date   ADD (attention deficit disorder)    Arthritis    Autoimmune hepatitis ([place])    Cancer ([place])    face/skin   Diabetes ([place])    Diabetes mellitus without complication ([place])    Fatty liver    High cholesterol    Hypertension     Joint pain    Left knee pain    Liver disease    Liver problem    Overweight    Sleep apnea    CPAP   SOB (shortness of breath)    Swelling of both lower extremities     Past Surgical History:  Procedure Laterality Date   FACIAL FRACTURE SURGERY  1973   KNEE ARTHROSCOPY  2005   LIVER BIOPSY  1996   NECK SURGERY      Current Facility-Administered Medications  Medication Dose Route Frequency Provider Last Rate Last Admin   [START ON 10/19/2020] ceFAZolin (ANCEF) 3 g in dextrose 5 % 50 mL IVPB  3 g Intravenous On Call to OR Mcarthur Rossetti, MD       Current Outpatient Medications  Medication Sig Dispense Refill Last Dose   Alogliptin Benzoate 25 MG TABS Take 25 mg by mouth daily.      atorvastatin (LIPITOR) 40 MG tablet Take 1 tablet (40 mg total) by mouth at bedtime. 90 tablet 0    azaTHIOprine (IMURAN) 50 MG tablet Take 200 mg by mouth daily.       budesonide (ENTOCORT EC) 3 MG 24 hr capsule Take 9 mg by mouth daily.       ferrous sulfate 325 (65 FE) MG tablet Take 325 mg by mouth daily with breakfast.      glipiZIDE (GLUCOTROL) 5 MG tablet Take 0.5 tablets (2.5 mg total) by mouth 2 (two) times daily before a meal. (Patient taking differently: Take 2.5 mg by mouth daily before breakfast.)      hydrochlorothiazide (HYDRODIURIL) 25 MG tablet Take 25 mg by  mouth daily.      latanoprost (XALATAN) 0.005 % ophthalmic solution Place 1 drop into both eyes at bedtime.      lisinopril (PRINIVIL,ZESTRIL) 2.5 MG tablet Take 2.5 mg by mouth daily.      metFORMIN (GLUCOPHAGE) 1000 MG tablet Take 1,000 mg by mouth 2 (two) times daily with a meal.      methylphenidate 27 MG PO CR tablet Take 27 mg by mouth daily.       rOPINIRole (REQUIP) 2 MG tablet Take 2 mg by mouth at bedtime.      tamsulosin (FLOMAX) 0.4 MG CAPS capsule Take 0.4 mg by mouth daily.      traMADol (ULTRAM) 50 MG tablet TAKE 2 TABLETS BY MOUTH EVERY 6 HOURS AS NEEDED (Patient taking differently:  Take 100 mg by mouth every 6 (six) hours as needed for severe pain.) 30 tablet 0    Allergies  Allergen Reactions   Pioglitazone Cough    Social History   Tobacco Use   Smoking status: Former Smoker    Types: Cigarettes    Quit date: 1995    Years since quitting: 27.1   Smokeless tobacco: Never Used  Substance Use Topics   Alcohol use: No    Family History  Problem Relation Age of Onset   Diabetes Father      Review of Systems  Musculoskeletal: Positive for joint swelling.  All other systems reviewed and are negative.   Objective:  Physical Exam Vitals reviewed.  Constitutional:      Appearance: Normal appearance.  HENT:     Head: Normocephalic and atraumatic.  Eyes:     Extraocular Movements: Extraocular movements intact.     Pupils: Pupils are equal, round, and reactive to light.  Cardiovascular:     Rate and Rhythm: Normal rate and regular rhythm.     Pulses: Normal pulses.  Pulmonary:     Effort: Pulmonary effort is normal.     Breath sounds: Normal breath sounds.  Abdominal:     Palpations: Abdomen is soft.  Musculoskeletal:     Cervical back: Normal range of motion.     Left knee: Effusion and bony tenderness present. Decreased range of motion. Tenderness present over the medial joint line, lateral joint line and patellar tendon. Abnormal alignment and abnormal meniscus.  Neurological:     Mental Status: He is alert and oriented to person, place, and time.  Psychiatric:        Behavior: Behavior normal.     Vital signs in last 24 hours:    Labs:   Estimated body mass index is 40.45 kg/m as calculated from the following:   Height as of 10/15/20: 5\' 8"  (1.727 m).   Weight as of 10/15/20: 120.7 kg.   Imaging Review Plain radiographs demonstrate severe degenerative joint disease of the left knee(s). The overall alignment ismild varus. The bone quality appears to be excellent for age and reported activity  level.      Assessment/Plan:  End stage arthritis, left knee   The patient history, physical examination, clinical judgment of the provider and imaging studies are consistent with end stage degenerative joint disease of the left knee(s) and total knee arthroplasty is deemed medically necessary. The treatment options including medical management, injection therapy arthroscopy and arthroplasty were discussed at length. The risks and benefits of total knee arthroplasty were presented and reviewed. The risks due to aseptic loosening, infection, stiffness, patella tracking problems, thromboembolic complications and other imponderables were discussed. The patient  acknowledged the explanation, agreed to proceed with the plan and consent was signed. Patient is being admitted for inpatient treatment for surgery, pain control, PT, OT, prophylactic antibiotics, VTE prophylaxis, progressive ambulation and ADL's and discharge planning. The patient is planning to be discharged home with home health services

## 2020-10-18 NOTE — Telephone Encounter (Signed)
Bryan Wang with the [facility] in [place] called back concerning some orders for patient for left knee surgery on 10/19/2020.  CB# [id number] [phone].  Please advise.  Thank you

## 2020-10-18 NOTE — Telephone Encounter (Signed)
LMOM for her that we need authorization for HHPT and PT following her surgery tomorrow

## 2020-10-19 ENCOUNTER — Encounter (HOSPITAL_COMMUNITY)
Admission: RE | Disposition: A | Discharge status: Home / Self Care | Payer: Self-pay | Source: Home / Self Care | Attending: Orthopaedic Surgery

## 2020-10-19 ENCOUNTER — Ambulatory Visit (HOSPITAL_COMMUNITY): Payer: No Typology Code available for payment source | Admitting: Anesthesiology

## 2020-10-19 ENCOUNTER — Other Ambulatory Visit: Payer: Self-pay

## 2020-10-19 ENCOUNTER — Encounter (HOSPITAL_COMMUNITY): Payer: Self-pay | Admitting: Orthopaedic Surgery

## 2020-10-19 ENCOUNTER — Observation Stay (HOSPITAL_COMMUNITY)
Admission: RE | Admit: 2020-10-19 | Discharge: 2020-10-21 | Disposition: A | Discharge status: Home / Self Care | Payer: No Typology Code available for payment source | Attending: Orthopaedic Surgery | Admitting: Orthopaedic Surgery

## 2020-10-19 ENCOUNTER — Observation Stay (HOSPITAL_COMMUNITY): Payer: No Typology Code available for payment source

## 2020-10-19 DIAGNOSIS — E119 Type 2 diabetes mellitus without complications: Secondary | ICD-10-CM | POA: Diagnosis not present

## 2020-10-19 DIAGNOSIS — M1712 Unilateral primary osteoarthritis, left knee: Principal | ICD-10-CM

## 2020-10-19 DIAGNOSIS — M25562 Pain in left knee: Secondary | ICD-10-CM | POA: Diagnosis present

## 2020-10-19 DIAGNOSIS — Z85820 Personal history of malignant melanoma of skin: Secondary | ICD-10-CM | POA: Diagnosis not present

## 2020-10-19 DIAGNOSIS — I10 Essential (primary) hypertension: Secondary | ICD-10-CM | POA: Insufficient documentation

## 2020-10-19 DIAGNOSIS — Z79899 Other long term (current) drug therapy: Secondary | ICD-10-CM | POA: Diagnosis not present

## 2020-10-19 DIAGNOSIS — Z87891 Personal history of nicotine dependence: Secondary | ICD-10-CM | POA: Insufficient documentation

## 2020-10-19 DIAGNOSIS — Z96652 Presence of left artificial knee joint: Secondary | ICD-10-CM

## 2020-10-19 DIAGNOSIS — Z7984 Long term (current) use of oral hypoglycemic drugs: Secondary | ICD-10-CM | POA: Insufficient documentation

## 2020-10-19 HISTORY — PX: TOTAL KNEE ARTHROPLASTY: SHX125

## 2020-10-19 LAB — TYPE AND SCREEN
ABO/RH(D): A POS
Antibody Screen: NEGATIVE

## 2020-10-19 LAB — GLUCOSE, CAPILLARY
Glucose-Capillary: 120 mg/dL — ABNORMAL HIGH (ref 70–99)
Glucose-Capillary: 153 mg/dL — ABNORMAL HIGH (ref 70–99)
Glucose-Capillary: 181 mg/dL — ABNORMAL HIGH (ref 70–99)

## 2020-10-19 LAB — ABO/RH: ABO/RH(D): A POS

## 2020-10-19 SURGERY — ARTHROPLASTY, KNEE, TOTAL
Anesthesia: Spinal | Site: Knee | Laterality: Left

## 2020-10-19 MED ORDER — FENTANYL CITRATE (PF) 100 MCG/2ML IJ SOLN
25.0000 ug | INTRAMUSCULAR | Status: DC | PRN
  Administered 2020-10-19: 50 ug via INTRAVENOUS

## 2020-10-19 MED ORDER — MENTHOL 3 MG MT LOZG: 1.0000 | LOZENGE | OROMUCOSAL | Status: DC | PRN

## 2020-10-19 MED ORDER — LISINOPRIL 5 MG PO TABS
2.5000 mg | ORAL_TABLET | Freq: Every day | ORAL | Status: DC
  Administered 2020-10-19 – 2020-10-21 (×3): 2.5 mg via ORAL
  Filled 2020-10-19 (×3): qty 1

## 2020-10-19 MED ORDER — ATORVASTATIN CALCIUM 40 MG PO TABS
40.0000 mg | ORAL_TABLET | Freq: Every day | ORAL | Status: DC
  Administered 2020-10-19 – 2020-10-20 (×2): 40 mg via ORAL
  Filled 2020-10-19 (×2): qty 1

## 2020-10-19 MED ORDER — PANTOPRAZOLE SODIUM 40 MG PO TBEC
40.0000 mg | DELAYED_RELEASE_TABLET | Freq: Every day | ORAL | Status: DC
  Administered 2020-10-19 – 2020-10-21 (×3): 40 mg via ORAL
  Filled 2020-10-19 (×3): qty 1

## 2020-10-19 MED ORDER — DOCUSATE SODIUM 100 MG PO CAPS
100.0000 mg | ORAL_CAPSULE | Freq: Two times a day (BID) | ORAL | Status: DC
  Administered 2020-10-19 – 2020-10-21 (×4): 100 mg via ORAL
  Filled 2020-10-19 (×4): qty 1

## 2020-10-19 MED ORDER — ASPIRIN 81 MG PO CHEW
81.0000 mg | CHEWABLE_TABLET | Freq: Two times a day (BID) | ORAL | Status: DC
  Administered 2020-10-19 – 2020-10-21 (×4): 81 mg via ORAL
  Filled 2020-10-19 (×4): qty 1

## 2020-10-19 MED ORDER — AZATHIOPRINE 50 MG PO TABS
200.0000 mg | ORAL_TABLET | Freq: Every day | ORAL | Status: DC
  Administered 2020-10-20 – 2020-10-21 (×2): 200 mg via ORAL
  Filled 2020-10-19 (×2): qty 4

## 2020-10-19 MED ORDER — ONDANSETRON HCL 4 MG PO TABS: 4.0000 mg | ORAL_TABLET | Freq: Four times a day (QID) | ORAL | Status: DC | PRN

## 2020-10-19 MED ORDER — ACETAMINOPHEN 500 MG PO TABS
1000.0000 mg | ORAL_TABLET | Freq: Once | ORAL | Status: AC
  Administered 2020-10-19: 1000 mg via ORAL
  Filled 2020-10-19: qty 2

## 2020-10-19 MED ORDER — PROPOFOL 1000 MG/100ML IV EMUL
INTRAVENOUS | Status: AC
  Filled 2020-10-19: qty 200

## 2020-10-19 MED ORDER — ACETAMINOPHEN 325 MG PO TABS: 325.0000 mg | ORAL_TABLET | Freq: Four times a day (QID) | ORAL | Status: DC | PRN

## 2020-10-19 MED ORDER — CELECOXIB 200 MG PO CAPS
200.0000 mg | ORAL_CAPSULE | Freq: Once | ORAL | Status: AC
  Administered 2020-10-19: 200 mg via ORAL
  Filled 2020-10-19: qty 1

## 2020-10-19 MED ORDER — METFORMIN HCL 500 MG PO TABS
1000.0000 mg | ORAL_TABLET | Freq: Two times a day (BID) | ORAL | Status: DC
  Administered 2020-10-20 – 2020-10-21 (×3): 1000 mg via ORAL
  Filled 2020-10-19 (×3): qty 2

## 2020-10-19 MED ORDER — BUPIVACAINE-EPINEPHRINE 0.25% -1:200000 IJ SOLN
INTRAMUSCULAR | Status: DC | PRN
  Administered 2020-10-19: 30 mL

## 2020-10-19 MED ORDER — FENTANYL CITRATE (PF) 100 MCG/2ML IJ SOLN
50.0000 ug | INTRAMUSCULAR | Status: DC
  Administered 2020-10-19: 100 ug via INTRAVENOUS
  Filled 2020-10-19: qty 2

## 2020-10-19 MED ORDER — METHOCARBAMOL 500 MG IVPB - SIMPLE MED
500.0000 mg | Freq: Four times a day (QID) | INTRAVENOUS | Status: DC | PRN
  Filled 2020-10-19: qty 50

## 2020-10-19 MED ORDER — OXYCODONE HCL 5 MG PO TABS
10.0000 mg | ORAL_TABLET | ORAL | Status: DC | PRN
  Administered 2020-10-19 – 2020-10-21 (×9): 15 mg via ORAL
  Filled 2020-10-19 (×9): qty 3

## 2020-10-19 MED ORDER — ONDANSETRON HCL 4 MG/2ML IJ SOLN
INTRAMUSCULAR | Status: DC | PRN
  Administered 2020-10-19: 4 mg via INTRAVENOUS

## 2020-10-19 MED ORDER — ONDANSETRON HCL 4 MG/2ML IJ SOLN: 4.0000 mg | Freq: Four times a day (QID) | INTRAMUSCULAR | Status: DC | PRN

## 2020-10-19 MED ORDER — ALOGLIPTIN BENZOATE 25 MG PO TABS: 25.0000 mg | ORAL_TABLET | Freq: Every day | ORAL | Status: DC

## 2020-10-19 MED ORDER — STERILE WATER FOR IRRIGATION IR SOLN
Status: DC | PRN
  Administered 2020-10-19: 2000 mL

## 2020-10-19 MED ORDER — GABAPENTIN 100 MG PO CAPS
100.0000 mg | ORAL_CAPSULE | Freq: Three times a day (TID) | ORAL | Status: DC
  Administered 2020-10-19 – 2020-10-21 (×5): 100 mg via ORAL
  Filled 2020-10-19 (×5): qty 1

## 2020-10-19 MED ORDER — LINAGLIPTIN 5 MG PO TABS
5.0000 mg | ORAL_TABLET | Freq: Every day | ORAL | Status: DC
  Administered 2020-10-20 – 2020-10-21 (×2): 5 mg via ORAL
  Filled 2020-10-19 (×2): qty 1

## 2020-10-19 MED ORDER — METHYLPHENIDATE HCL ER (OSM) 27 MG PO TBCR
27.0000 mg | EXTENDED_RELEASE_TABLET | Freq: Every day | ORAL | Status: DC
  Filled 2020-10-19 (×4): qty 1

## 2020-10-19 MED ORDER — PHENOL 1.4 % MT LIQD: 1.0000 | OROMUCOSAL | Status: DC | PRN

## 2020-10-19 MED ORDER — METOCLOPRAMIDE HCL 5 MG/ML IJ SOLN: 5.0000 mg | Freq: Three times a day (TID) | INTRAMUSCULAR | Status: DC | PRN

## 2020-10-19 MED ORDER — BUPIVACAINE-EPINEPHRINE (PF) 0.25% -1:200000 IJ SOLN
INTRAMUSCULAR | Status: AC
  Filled 2020-10-19: qty 30

## 2020-10-19 MED ORDER — PROPOFOL 500 MG/50ML IV EMUL
INTRAVENOUS | Status: DC | PRN
  Administered 2020-10-19: 100 ug/kg/min via INTRAVENOUS

## 2020-10-19 MED ORDER — MIDAZOLAM HCL 2 MG/2ML IJ SOLN
1.0000 mg | INTRAMUSCULAR | Status: DC
  Administered 2020-10-19: 2 mg via INTRAVENOUS
  Filled 2020-10-19: qty 2

## 2020-10-19 MED ORDER — FENTANYL CITRATE (PF) 100 MCG/2ML IJ SOLN
INTRAMUSCULAR | Status: AC
  Filled 2020-10-19: qty 2

## 2020-10-19 MED ORDER — 0.9 % SODIUM CHLORIDE (POUR BTL) OPTIME
TOPICAL | Status: DC | PRN
  Administered 2020-10-19: 1000 mL

## 2020-10-19 MED ORDER — ROPIVACAINE HCL 7.5 MG/ML IJ SOLN
INTRAMUSCULAR | Status: DC | PRN
  Administered 2020-10-19: 20 mL via PERINEURAL

## 2020-10-19 MED ORDER — ROPINIROLE HCL 1 MG PO TABS: 2.0000 mg | ORAL_TABLET | Freq: Every day | ORAL | Status: DC

## 2020-10-19 MED ORDER — METHOCARBAMOL 500 MG PO TABS
500.0000 mg | ORAL_TABLET | Freq: Four times a day (QID) | ORAL | Status: DC | PRN
  Administered 2020-10-19 – 2020-10-21 (×5): 500 mg via ORAL
  Filled 2020-10-19 (×4): qty 1

## 2020-10-19 MED ORDER — CHLORHEXIDINE GLUCONATE 0.12 % MT SOLN
15.0000 mL | Freq: Once | OROMUCOSAL | Status: AC
  Administered 2020-10-19: 15 mL via OROMUCOSAL

## 2020-10-19 MED ORDER — LATANOPROST 0.005 % OP SOLN
1.0000 [drp] | Freq: Every day | OPHTHALMIC | Status: DC
  Administered 2020-10-19 – 2020-10-20 (×2): 1 [drp] via OPHTHALMIC
  Filled 2020-10-19: qty 2.5

## 2020-10-19 MED ORDER — AMISULPRIDE (ANTIEMETIC) 5 MG/2ML IV SOLN: 10.0000 mg | Freq: Once | INTRAVENOUS | Status: DC | PRN

## 2020-10-19 MED ORDER — BUPIVACAINE IN DEXTROSE 0.75-8.25 % IT SOLN
INTRATHECAL | Status: DC | PRN
  Administered 2020-10-19: 1.8 mL via INTRATHECAL

## 2020-10-19 MED ORDER — DEXAMETHASONE SODIUM PHOSPHATE 10 MG/ML IJ SOLN
INTRAMUSCULAR | Status: AC
  Filled 2020-10-19: qty 1

## 2020-10-19 MED ORDER — LACTATED RINGERS IV SOLN: INTRAVENOUS | Status: DC

## 2020-10-19 MED ORDER — BUDESONIDE 3 MG PO CPEP
9.0000 mg | ORAL_CAPSULE | Freq: Every day | ORAL | Status: DC
Start: 2020-10-20 — End: 2020-10-21
  Administered 2020-10-20 – 2020-10-21 (×2): 9 mg via ORAL
  Filled 2020-10-19 (×2): qty 3

## 2020-10-19 MED ORDER — ORAL CARE MOUTH RINSE: 15.0000 mL | Freq: Once | OROMUCOSAL | Status: AC

## 2020-10-19 MED ORDER — DEXAMETHASONE SODIUM PHOSPHATE 10 MG/ML IJ SOLN
INTRAMUSCULAR | Status: DC | PRN
  Administered 2020-10-19 (×2): 10 mg

## 2020-10-19 MED ORDER — ROPINIROLE HCL 1 MG PO TABS
2.0000 mg | ORAL_TABLET | Freq: Every day | ORAL | Status: DC
  Administered 2020-10-19 – 2020-10-20 (×2): 2 mg via ORAL
  Filled 2020-10-19 (×2): qty 2

## 2020-10-19 MED ORDER — ALUM & MAG HYDROXIDE-SIMETH 200-200-20 MG/5ML PO SUSP: 30.0000 mL | ORAL | Status: DC | PRN

## 2020-10-19 MED ORDER — SODIUM CHLORIDE 0.9 % IR SOLN
Status: DC | PRN
  Administered 2020-10-19: 1000 mL

## 2020-10-19 MED ORDER — METHOCARBAMOL 500 MG PO TABS
ORAL_TABLET | ORAL | Status: AC
  Administered 2020-10-19: 500 mg
  Filled 2020-10-19: qty 1

## 2020-10-19 MED ORDER — HYDROCHLOROTHIAZIDE 25 MG PO TABS
25.0000 mg | ORAL_TABLET | Freq: Every day | ORAL | Status: DC
Start: 2020-10-19 — End: 2020-10-21
  Administered 2020-10-19 – 2020-10-21 (×3): 25 mg via ORAL
  Filled 2020-10-19 (×3): qty 1

## 2020-10-19 MED ORDER — TAMSULOSIN HCL 0.4 MG PO CAPS
0.4000 mg | ORAL_CAPSULE | Freq: Every day | ORAL | Status: DC
  Administered 2020-10-19 – 2020-10-21 (×3): 0.4 mg via ORAL
  Filled 2020-10-19 (×3): qty 1

## 2020-10-19 MED ORDER — METFORMIN HCL 500 MG PO TABS: 1000.0000 mg | ORAL_TABLET | Freq: Two times a day (BID) | ORAL | Status: DC

## 2020-10-19 MED ORDER — PROPOFOL 10 MG/ML IV BOLUS
INTRAVENOUS | Status: DC | PRN
  Administered 2020-10-19: 30 mg via INTRAVENOUS
  Administered 2020-10-19: 40 mg via INTRAVENOUS
  Administered 2020-10-19: 30 mg via INTRAVENOUS

## 2020-10-19 MED ORDER — DIPHENHYDRAMINE HCL 12.5 MG/5ML PO ELIX: 12.5000 mg | ORAL_SOLUTION | ORAL | Status: DC | PRN

## 2020-10-19 MED ORDER — SODIUM CHLORIDE 0.9 % IV SOLN: INTRAVENOUS | Status: DC

## 2020-10-19 MED ORDER — METOCLOPRAMIDE HCL 5 MG PO TABS: 5.0000 mg | ORAL_TABLET | Freq: Three times a day (TID) | ORAL | Status: DC | PRN

## 2020-10-19 MED ORDER — OXYCODONE HCL 5 MG PO TABS: 5.0000 mg | ORAL_TABLET | ORAL | Status: DC | PRN

## 2020-10-19 MED ORDER — GLIPIZIDE 5 MG PO TABS
2.5000 mg | ORAL_TABLET | Freq: Every day | ORAL | Status: DC
  Administered 2020-10-20 – 2020-10-21 (×2): 2.5 mg via ORAL
  Filled 2020-10-19 (×2): qty 1

## 2020-10-19 MED ORDER — HYDROMORPHONE HCL 1 MG/ML IJ SOLN
0.5000 mg | INTRAMUSCULAR | Status: DC | PRN
  Administered 2020-10-20: 0.5 mg via INTRAVENOUS
  Filled 2020-10-19: qty 1

## 2020-10-19 MED ORDER — POVIDONE-IODINE 10 % EX SWAB
2.0000 "application " | Freq: Once | CUTANEOUS | Status: AC
  Administered 2020-10-19: 2 via TOPICAL

## 2020-10-19 MED ORDER — ONDANSETRON HCL 4 MG/2ML IJ SOLN
INTRAMUSCULAR | Status: AC
  Filled 2020-10-19: qty 2

## 2020-10-19 MED ORDER — TRANEXAMIC ACID-NACL 1000-0.7 MG/100ML-% IV SOLN
1000.0000 mg | INTRAVENOUS | Status: AC
  Administered 2020-10-19: 1000 mg via INTRAVENOUS
  Filled 2020-10-19: qty 100

## 2020-10-19 MED ORDER — CEFAZOLIN SODIUM-DEXTROSE 2-4 GM/100ML-% IV SOLN
2.0000 g | Freq: Four times a day (QID) | INTRAVENOUS | Status: AC
  Administered 2020-10-19 – 2020-10-20 (×2): 2 g via INTRAVENOUS
  Filled 2020-10-19 (×2): qty 100

## 2020-10-19 MED ORDER — FERROUS SULFATE 325 (65 FE) MG PO TABS
325.0000 mg | ORAL_TABLET | Freq: Every day | ORAL | Status: DC
  Administered 2020-10-20 – 2020-10-21 (×2): 325 mg via ORAL
  Filled 2020-10-19 (×2): qty 1

## 2020-10-19 SURGICAL SUPPLY — 53 items
BAG ZIPLOCK 12X15 (MISCELLANEOUS) IMPLANT
BENZOIN TINCTURE PRP APPL 2/3 (GAUZE/BANDAGES/DRESSINGS) IMPLANT
BLADE SAG 18X100X1.27 (BLADE) IMPLANT
BLADE SURG SZ10 CARB STEEL (BLADE) ×4 IMPLANT
BNDG ELASTIC 6X5.8 VLCR STR LF (GAUZE/BANDAGES/DRESSINGS) ×2 IMPLANT
BOWL SMART MIX CTS (DISPOSABLE) IMPLANT
COOLER ICEMAN CLASSIC (MISCELLANEOUS) ×2 IMPLANT
COVER SURGICAL LIGHT HANDLE (MISCELLANEOUS) ×2 IMPLANT
COVER WAND RF STERILE (DRAPES) IMPLANT
CUFF TOURN SGL QUICK 34 (TOURNIQUET CUFF) ×1
CUFF TRNQT CYL 34X4.125X (TOURNIQUET CUFF) ×1 IMPLANT
DECANTER SPIKE VIAL GLASS SM (MISCELLANEOUS) IMPLANT
DRAPE U-SHAPE 47X51 STRL (DRAPES) ×2 IMPLANT
DRSG PAD ABDOMINAL 8X10 ST (GAUZE/BANDAGES/DRESSINGS) ×2 IMPLANT
DURAPREP 26ML APPLICATOR (WOUND CARE) ×2 IMPLANT
ELECT BLADE TIP CTD 4 INCH (ELECTRODE) ×2 IMPLANT
ELECT REM PT RETURN 15FT ADLT (MISCELLANEOUS) ×2 IMPLANT
FEMORAL POSTERIOR SZ5 LFT (Femur) ×1 IMPLANT
GAUZE SPONGE 4X4 12PLY STRL (GAUZE/BANDAGES/DRESSINGS) ×2 IMPLANT
GAUZE XEROFORM 1X8 LF (GAUZE/BANDAGES/DRESSINGS) ×2 IMPLANT
GLOVE SRG 8 PF TXTR STRL LF DI (GLOVE) ×2 IMPLANT
GLOVE SURG ENC MOIS LTX SZ7.5 (GLOVE) ×2 IMPLANT
GLOVE SURG LTX SZ8 (GLOVE) ×2 IMPLANT
GLOVE SURG UNDER POLY LF SZ8 (GLOVE) ×2
GOWN STRL REUS W/TWL XL LVL3 (GOWN DISPOSABLE) ×4 IMPLANT
HANDPIECE INTERPULSE COAX TIP (DISPOSABLE) ×1
HOLDER FOLEY CATH W/STRAP (MISCELLANEOUS) ×2 IMPLANT
IMMOBILIZER KNEE 20 (SOFTGOODS) ×2
IMMOBILIZER KNEE 20 THIGH 36 (SOFTGOODS) ×1 IMPLANT
INSERT TIB PS SZ5 11 KNEE (Miscellaneous) ×2 IMPLANT
KIT TURNOVER KIT A (KITS) ×2 IMPLANT
KNEE PATELLA ASYMMETRIC 10X32 (Knees) ×2 IMPLANT
KNEE TIBIAL COMPONENT SZ5 (Knees) ×2 IMPLANT
NS IRRIG 1000ML POUR BTL (IV SOLUTION) ×2 IMPLANT
PACK TOTAL KNEE CUSTOM (KITS) ×2 IMPLANT
PAD COLD SHLDR WRAP-ON (PAD) ×2 IMPLANT
PADDING CAST COTTON 6X4 STRL (CAST SUPPLIES) ×2 IMPLANT
PENCIL SMOKE EVACUATOR (MISCELLANEOUS) IMPLANT
PIN FLUTED HEDLESS FIX 3.5X1/8 (PIN) ×2 IMPLANT
POSTERIOR FEMORAL SZ5 LFT (Femur) ×2 IMPLANT
PROTECTOR NERVE ULNAR (MISCELLANEOUS) ×2 IMPLANT
SET HNDPC FAN SPRY TIP SCT (DISPOSABLE) ×1 IMPLANT
SET PAD KNEE POSITIONER (MISCELLANEOUS) ×2 IMPLANT
STAPLER VISISTAT 35W (STAPLE) IMPLANT
STRIP CLOSURE SKIN 1/2X4 (GAUZE/BANDAGES/DRESSINGS) IMPLANT
SUT MNCRL AB 4-0 PS2 18 (SUTURE) IMPLANT
SUT VIC AB 0 CT1 27 (SUTURE) ×1
SUT VIC AB 0 CT1 27XBRD ANTBC (SUTURE) ×1 IMPLANT
SUT VIC AB 1 CT1 36 (SUTURE) ×4 IMPLANT
SUT VIC AB 2-0 CT1 27 (SUTURE) ×2
SUT VIC AB 2-0 CT1 TAPERPNT 27 (SUTURE) ×2 IMPLANT
TRAY FOLEY MTR SLVR 16FR STAT (SET/KITS/TRAYS/PACK) ×2 IMPLANT
WATER STERILE IRR 1000ML POUR (IV SOLUTION) ×2 IMPLANT

## 2020-10-19 NOTE — Interval H&P Note (Signed)
History and Physical Interval Note: The patient understands fully that he is here today for a left total knee arthroplasty to treat the pain from left hip osteoarthritis.  There has been no acute change in his medical status.  See recent H&P.  The risks and benefits of surgery have been discussed and informed consent is obtained.  10/19/2020 11:11 AM  [name]  has presented today for surgery, with the diagnosis of Osteoarthritis Left Knee.  The various methods of treatment have been discussed with the patient and family. After consideration of risks, benefits and other options for treatment, the patient has consented to  Procedure(s): LEFT TOTAL KNEE ARTHROPLASTY (Left) as a surgical intervention.  The patient's history has been reviewed, patient examined, no change in status, stable for surgery.  I have reviewed the patient's chart and labs.  Questions were answered to the patient's satisfaction.     Mcarthur Rossetti

## 2020-10-19 NOTE — Progress Notes (Signed)
Assisted Dr. Singer with left, ultrasound guided, adductor canal block. Side rails up, monitors on throughout procedure. See vital signs in flow sheet. Tolerated Procedure well.  

## 2020-10-19 NOTE — Anesthesia Procedure Notes (Addendum)
Anesthesia Regional Block: Adductor canal block   Pre-Anesthetic Checklist: ,, timeout performed, Correct Patient, Correct Site, Correct Laterality, Correct Procedure, Correct Position, site marked, Risks and benefits discussed,  Surgical consent,  Pre-op evaluation,  At surgeon's request and post-op pain management  Laterality: Left  Prep: chloraprep       Needles:  Injection technique: Single-shot  Needle Type: Stimulator Needle - 80     Needle Length: 10cm  Needle Gauge: 21     Additional Needles:   Narrative:  Start time: 10/19/2020 11:48 AM End time: 10/19/2020 11:58 AM Injection made incrementally with aspirations every 5 mL.  Performed by: Personally

## 2020-10-19 NOTE — Anesthesia Procedure Notes (Signed)
Spinal  Patient location during procedure: OR Start time: 10/19/2020 1:00 PM End time: 10/19/2020 1:02 PM Staffing Performed: resident/CRNA  Resident/CRNA: [place] Briauna Gilmartin C, CRNA Preanesthetic Checklist Completed: patient identified, IV checked, site marked, risks and benefits discussed, surgical consent, monitors and equipment checked, pre-op evaluation and timeout performed Spinal Block Patient position: sitting Prep: DuraPrep and site prepped and draped Patient monitoring: heart rate, cardiac monitor, continuous pulse ox and blood pressure Approach: midline Location: L3-4 Injection technique: single-shot Needle Needle type: Pencan  Needle gauge: 24 G Needle length: 9 cm Assessment Sensory level: T4 Additional Notes IV functioning, monitors applied to pt. Expiration date of kit checked and confirmed to be in date. Sterile prep and drape, hand hygiene and sterile gloved used. Pt was positioned and spine was prepped in sterile fashion. Skin was anesthetized with lidocaine. Free flow of clear CSF obtained prior to injecting local anesthetic into CSF x 1 attempt. Spinal needle aspirated freely following injection. Needle was carefully withdrawn, and pt tolerated procedure well. Loss of motor and sensory on exam post injection.

## 2020-10-19 NOTE — Anesthesia Postprocedure Evaluation (Signed)
Anesthesia Post Note  Patient: Bryan Wang  Procedure(s) Performed: LEFT TOTAL KNEE ARTHROPLASTY (Left Knee)     Patient location during evaluation: PACU Anesthesia Type: Spinal Level of consciousness: awake and alert Pain management: pain level controlled Vital Signs Assessment: post-procedure vital signs reviewed and stable Respiratory status: spontaneous breathing and respiratory function stable Cardiovascular status: blood pressure returned to baseline and stable Postop Assessment: spinal receding Anesthetic complications: no   No complications documented.  Last Vitals:  Vitals:   10/19/20 1451 10/19/20 1500  BP: (!) 144/78 127/66  Pulse: 90 82  Resp: 16 18  Temp: 36.8 C   SpO2: 99% 100%    Last Pain:  Vitals:   10/19/20 1500  TempSrc:   PainSc: 0-No pain                 SINGER,JAMES DANIEL

## 2020-10-19 NOTE — Plan of Care (Signed)
  Problem: Coping: Goal: Level of anxiety will decrease Outcome: Progressing   Problem: Activity: Goal: Risk for activity intolerance will decrease Outcome: Progressing   Problem: Clinical Measurements: Goal: Diagnostic test results will improve Outcome: Progressing

## 2020-10-19 NOTE — Brief Op Note (Signed)
10/19/2020  2:19 PM  PATIENT:  [name]  70 y.o. male  PRE-OPERATIVE DIAGNOSIS:  Osteoarthritis Left Knee  POST-OPERATIVE DIAGNOSIS:  Osteoarthritis Left Knee  PROCEDURE:  Procedure(s): LEFT TOTAL KNEE ARTHROPLASTY (Left)  SURGEON:  Surgeon(s) and Role:    Mcarthur Rossetti, MD - Primary  PHYSICIAN ASSISTANT:  Benita Stabile, PA-C  ANESTHESIA:   local, regional and spinal  EBL:  50 mL   COUNTS:  YES  TOURNIQUET:   Total Tourniquet Time Documented: Thigh (Left) - 41 minutes Total: Thigh (Left) - 41 minutes   DICTATION: .Other Dictation: Dictation Number [id number]  PLAN OF CARE: Admit for overnight observation  PATIENT DISPOSITION:  PACU - hemodynamically stable.   Delay start of Pharmacological VTE agent (>24hrs) due to surgical blood loss or risk of bleeding: no

## 2020-10-19 NOTE — Transfer of Care (Signed)
Immediate Anesthesia Transfer of Care Note  Patient: Bryan Wang  Procedure(s) Performed: LEFT TOTAL KNEE ARTHROPLASTY (Left Knee)  Patient Location: PACU  Anesthesia Type:Spinal  Level of Consciousness: drowsy and patient cooperative  Airway & Oxygen Therapy: Patient Spontanous Breathing and Patient connected to face mask oxygen  Post-op Assessment: Report given to RN and Post -op Vital signs reviewed and stable  Post vital signs: Reviewed and stable  Last Vitals:  Vitals Value Taken Time  BP 144/78 10/19/20 1451  Temp    Pulse 88 10/19/20 1453  Resp 23 10/19/20 1453  SpO2 99 % 10/19/20 1453  Vitals shown include unvalidated device data.  Last Pain:  Vitals:   10/19/20 1215  TempSrc:   PainSc: 0-No pain      Patients Stated Pain Goal: 4 (32/00/37 9444)  Complications: No complications documented.

## 2020-10-19 NOTE — Anesthesia Preprocedure Evaluation (Addendum)
Anesthesia Evaluation  Patient identified by MRN, date of birth, ID band Patient awake    Reviewed: Allergy & Precautions, NPO status , Patient's Chart, lab work & pertinent test results  History of Anesthesia Complications Negative for: history of anesthetic complications  Airway Mallampati: II  TM Distance: >3 FB Neck ROM: Full    Dental  (+) Poor Dentition, Dental Advisory Given   Pulmonary sleep apnea and Continuous Positive Airway Pressure Ventilation , former smoker,    Pulmonary exam normal        Cardiovascular hypertension, Pt. on medications Normal cardiovascular exam     Neuro/Psych negative neurological ROS     GI/Hepatic negative GI ROS, (+) Hepatitis -, Autoimmune  Endo/Other  diabetes  Renal/GU negative Renal ROS     Musculoskeletal negative musculoskeletal ROS (+)   Abdominal   Peds  Hematology negative hematology ROS (+)   Anesthesia Other Findings   Reproductive/Obstetrics                            Anesthesia Physical Anesthesia Plan  ASA: III  Anesthesia Plan: Spinal   Post-op Pain Management:  Regional for Post-op pain   Induction:   PONV Risk Score and Plan: 2 and Ondansetron, Propofol infusion and Midazolam  Airway Management Planned: Natural Airway  Additional Equipment:   Intra-op Plan:   Post-operative Plan:   Informed Consent: I have reviewed the patients History and Physical, chart, labs and discussed the procedure including the risks, benefits and alternatives for the proposed anesthesia with the patient or authorized representative who has indicated his/her understanding and acceptance.     Dental advisory given  Plan Discussed with: Anesthesiologist and CRNA  Anesthesia Plan Comments:        Anesthesia Quick Evaluation

## 2020-10-20 DIAGNOSIS — M1712 Unilateral primary osteoarthritis, left knee: Secondary | ICD-10-CM | POA: Diagnosis not present

## 2020-10-20 LAB — BASIC METABOLIC PANEL
Anion gap: 8 (ref 5–15)
BUN: 16 mg/dL (ref 8–23)
CO2: 26 mmol/L (ref 22–32)
Calcium: 8.7 mg/dL — ABNORMAL LOW (ref 8.9–10.3)
Chloride: 100 mmol/L (ref 98–111)
Creatinine, Ser: 0.99 mg/dL (ref 0.61–1.24)
GFR, Estimated: >60 mL/min (ref >60)
Glucose, Bld: 197 mg/dL — ABNORMAL HIGH (ref 70–99)
Potassium: 4.1 mmol/L (ref 3.5–5.1)
Sodium: 134 mmol/L — ABNORMAL LOW (ref 135–145)

## 2020-10-20 LAB — CBC
HCT: 42.1 % (ref 39.0–52.0)
Hemoglobin: 13.8 g/dL (ref 13.0–17.0)
MCH: 30 pg (ref 26.0–34.0)
MCHC: 32.8 g/dL (ref 30.0–36.0)
MCV: 91.5 fL (ref 80.0–100.0)
Platelets: 260 10*3/uL (ref 150–400)
RBC: 4.6 MIL/uL (ref 4.22–5.81)
RDW: 17 % — ABNORMAL HIGH (ref 11.5–15.5)
WBC: 16.9 10*3/uL — ABNORMAL HIGH (ref 4.0–10.5)
nRBC: 0 % (ref 0.0–0.2)

## 2020-10-20 MED ORDER — ASPIRIN 81 MG PO CHEW: 81.0000 mg | CHEWABLE_TABLET | Freq: Two times a day (BID) | ORAL | 0 refills | Status: AC

## 2020-10-20 MED ORDER — METHOCARBAMOL 500 MG PO TABS: 500.0000 mg | ORAL_TABLET | Freq: Four times a day (QID) | ORAL | 1 refills | Status: AC | PRN

## 2020-10-20 MED ORDER — OXYCODONE HCL 5 MG PO TABS: 5.0000 mg | ORAL_TABLET | ORAL | 0 refills | Status: DC | PRN

## 2020-10-20 NOTE — Discharge Summary (Signed)
Patient ID: Bryan Wang MRN: [id number] DOB/AGE: 70-31-1952 70 y.o.  Admit date: 10/19/2020 Discharge date: 10/20/2020  Admission Diagnoses:  Principal Problem:   Unilateral primary osteoarthritis, left knee Active Problems:   Status post total left knee replacement   Discharge Diagnoses:  Same  Past Medical History:  Diagnosis Date  . ADD (attention deficit disorder)   . Arthritis   . Autoimmune hepatitis ([place])   . Cancer ([place])    face/skin  . Diabetes ([place])   . Diabetes mellitus without complication ([place])   . Fatty liver   . High cholesterol   . Hypertension   . Joint pain   . Left knee pain   . Liver disease   . Liver problem   . Overweight   . Sleep apnea    CPAP  . SOB (shortness of breath)   . Swelling of both lower extremities     Surgeries: Procedure(s): LEFT TOTAL KNEE ARTHROPLASTY on 10/19/2020   Consultants:   Discharged Condition: Improved  Hospital Course: Bryan Wang is an 70 y.o. male who was admitted 10/19/2020 for operative treatment ofUnilateral primary osteoarthritis, left knee. Patient has severe unremitting pain that affects sleep, daily activities, and work/hobbies. After pre-op clearance the patient was taken to the operating room on 10/19/2020 and underwent  Procedure(s): LEFT TOTAL KNEE ARTHROPLASTY.    Patient was given perioperative antibiotics:  Anti-infectives (From admission, onward)   Start     Dose/Rate Route Frequency Ordered Stop   10/19/20 1830  ceFAZolin (ANCEF) IVPB 2g/100 mL premix        2 g 200 mL/hr over 30 Minutes Intravenous Every 6 hours 10/19/20 1644 10/20/20 0034   10/19/20 0600  ceFAZolin (ANCEF) 3 g in dextrose 5 % 50 mL IVPB        3 g 100 mL/hr over 30 Minutes Intravenous On call to O.R. 10/18/20 0744 10/19/20 1312       Patient was given sequential compression devices, early ambulation, and chemoprophylaxis to prevent DVT.  Patient benefited maximally from hospital stay and there were no  complications.    Recent vital signs:  Patient Vitals for the past 24 hrs:  BP Temp Temp src Pulse Resp SpO2  10/20/20 0600 136/68 (!) 97.5 F (36.4 C) Oral 70 16 99 %  10/20/20 0201 (!) 142/71 99.3 F (37.4 C) Oral 62 16 98 %  10/19/20 2140 (!) 146/77 98 F (36.7 C) - 70 16 95 %  10/19/20 2005 (!) 142/87 98 F (36.7 C) Oral (!) 101 16 92 %  10/19/20 1850 (!) 153/85 97.8 F (36.6 C) - (!) 105 17 92 %  10/19/20 1749 (!) 167/79 98.4 F (36.9 C) Oral 68 17 98 %  10/19/20 1647 (!) 148/88 97.6 F (36.4 C) Axillary 67 18 98 %  10/19/20 1630 (!) 156/80 - - (!) 57 (!) 23 98 %  10/19/20 1615 (!) 154/93 - - 94 17 95 %  10/19/20 1600 (!) 146/71 - - (!) 59 (!) 23 97 %  10/19/20 1545 (!) 140/95 - - 72 14 95 %  10/19/20 1530 (!) 149/89 - - 71 (!) 26 100 %  10/19/20 1515 138/80 98 F (36.7 C) - (!) 55 19 100 %  10/19/20 1500 127/66 - - 82 18 100 %  10/19/20 1451 (!) 144/78 98.3 F (36.8 C) - 90 16 99 %  10/19/20 1230 113/66 - - (!) 57 17 99 %  10/19/20 1215 - - - (!) 57 17 99 %  10/19/20 1210 (!) 107/47 - - (!) 58 15 100 %  10/19/20 1205 (!) 103/46 - - 63 20 100 %  10/19/20 1204 120/64 - - 66 (!) 23 99 %  10/19/20 1200 - - - 63 17 99 %  10/19/20 1159 139/71 - - 60 16 97 %  10/19/20 1155 - - - 72 16 100 %  10/19/20 1154 - - - 71 16 100 %  10/19/20 1151 132/63 - - 60 14 100 %     Recent laboratory studies:  Recent Labs    10/20/20 0323  WBC 16.9*  HGB 13.8  HCT 42.1  PLT 260  NA 134*  K 4.1  CL 100  CO2 26  BUN 16  CREATININE 0.99  GLUCOSE 197*  CALCIUM 8.7*     Discharge Medications:   Allergies as of 10/20/2020      Reactions   Pioglitazone Cough      Medication List    STOP taking these medications   traMADol 50 MG tablet Commonly known as: ULTRAM     TAKE these medications   Alogliptin Benzoate 25 MG Tabs Take 25 mg by mouth daily.   aspirin 81 MG chewable tablet Chew 1 tablet (81 mg total) by mouth 2 (two) times daily.   atorvastatin 40 MG  tablet Commonly known as: LIPITOR Take 1 tablet (40 mg total) by mouth at bedtime.   azaTHIOprine 50 MG tablet Commonly known as: IMURAN Take 200 mg by mouth daily.   budesonide 3 MG 24 hr capsule Commonly known as: ENTOCORT EC Take 9 mg by mouth daily.   ferrous sulfate 325 (65 FE) MG tablet Take 325 mg by mouth daily with breakfast.   glipiZIDE 5 MG tablet Commonly known as: GLUCOTROL Take 0.5 tablets (2.5 mg total) by mouth 2 (two) times daily before a meal. What changed: when to take this   hydrochlorothiazide 25 MG tablet Commonly known as: HYDRODIURIL Take 25 mg by mouth daily.   latanoprost 0.005 % ophthalmic solution Commonly known as: XALATAN Place 1 drop into both eyes at bedtime.   lisinopril 2.5 MG tablet Commonly known as: ZESTRIL Take 2.5 mg by mouth daily.   metFORMIN 1000 MG tablet Commonly known as: GLUCOPHAGE Take 1,000 mg by mouth 2 (two) times daily with a meal.   methocarbamol 500 MG tablet Commonly known as: ROBAXIN Take 1 tablet (500 mg total) by mouth every 6 (six) hours as needed for muscle spasms.   methylphenidate 27 MG CR tablet Commonly known as: CONCERTA Take 27 mg by mouth daily.   oxyCODONE 5 MG immediate release tablet Commonly known as: Oxy IR/ROXICODONE Take 1-2 tablets (5-10 mg total) by mouth every 4 (four) hours as needed for moderate pain (pain score 4-6).   rOPINIRole 2 MG tablet Commonly known as: REQUIP Take 2 mg by mouth at bedtime.   tamsulosin 0.4 MG Caps capsule Commonly known as: FLOMAX Take 0.4 mg by mouth daily.            Durable Medical Equipment  (From admission, onward)         Start     Ordered   10/19/20 1645  DME Walker rolling  Once       Question Answer Comment  Walker: With 5 Inch Wheels   Patient needs a walker to treat with the following condition Status post total left knee replacement      10/19/20 1644   10/19/20 1645  DME 3 n 1  Once  10/19/20 1644          Diagnostic  Studies: DG Knee Left Port  Result Date: 10/19/2020 CLINICAL DATA:  Status post left knee replacement today. EXAM: PORTABLE LEFT KNEE - 1-2 VIEW COMPARISON:  Plain films left knee 07/26/2020. FINDINGS: New left knee arthroplasty is in place. Hardware is intact. No acute abnormality. Gas in the soft tissues and surgical staples noted. IMPRESSION: Status post left knee replacement.  No acute abnormality. Electronically Signed   By: Inge Rise M.D.   On: 10/19/2020 15:30    Disposition: Discharge disposition: 01-Home or Self Care          Follow-up Information    Mcarthur Rossetti, MD Follow up in 2 week(s).   Specialty: Orthopedic Surgery Contact information: [address] [place] [address] [phone]                Signed: Mcarthur Rossetti 10/20/2020, 11:27 AM

## 2020-10-20 NOTE — Plan of Care (Signed)
  Problem: Education: Goal: Knowledge of General Education information will improve Description: Including pain rating scale, medication(s)/side effects and non-pharmacologic comfort measures Outcome: Progressing   Problem: Activity: Goal: Risk for activity intolerance will decrease Outcome: Progressing   Problem: Elimination: Goal: Will not experience complications related to bowel motility Outcome: Progressing   

## 2020-10-20 NOTE — Discharge Instructions (Signed)

## 2020-10-20 NOTE — Progress Notes (Signed)
Subjective: 1 Day Post-Op Procedure(s) (LRB): LEFT TOTAL KNEE ARTHROPLASTY (Left) Patient reports pain as moderate.    Objective: Vital signs in last 24 hours: Temp:  [97.5 F (36.4 C)-99.3 F (37.4 C)] 97.5 F (36.4 C) (03/05 0600) Pulse Rate:  [55-105] 70 (03/05 0600) Resp:  [14-26] 16 (03/05 0600) BP: (103-167)/(46-95) 136/68 (03/05 0600) SpO2:  [92 %-100 %] 99 % (03/05 0600)  Intake/Output from previous day: 03/04 0701 - 03/05 0700 In: 3125.7 [P.O.:450; I.V.:2503.8; IV Piggyback:172] Out: 1694 [Urine:5825; Blood:50] Intake/Output this shift: Total I/O In: 267.5 [P.O.:250; I.V.:17.5] Out: -   Recent Labs    10/20/20 0323  HGB 13.8   Recent Labs    10/20/20 0323  WBC 16.9*  RBC 4.60  HCT 42.1  PLT 260   Recent Labs    10/20/20 0323  NA 134*  K 4.1  CL 100  CO2 26  BUN 16  CREATININE 0.99  GLUCOSE 197*  CALCIUM 8.7*   No results for input(s): LABPT, INR in the last 72 hours.  Sensation intact distally Intact pulses distally Dorsiflexion/Plantar flexion intact Incision: scant drainage No cellulitis present Compartment soft   Assessment/Plan: 1 Day Post-Op Procedure(s) (LRB): LEFT TOTAL KNEE ARTHROPLASTY (Left) Up with therapy Discharge home with home health    Patient's anticipated LOS is less than 2 midnights, meeting these requirements: - Younger than 35 - Lives within 1 hour of care - Has a competent adult at home to recover with post-op recover - NO history of  - Chronic pain requiring opiods  - Diabetes  - Coronary Artery Disease  - Heart failure  - Heart attack  - Stroke  - DVT/VTE  - Cardiac arrhythmia  - Respiratory Failure/COPD  - Renal failure  - Anemia  - Advanced Liver disease       Mcarthur Rossetti 10/20/2020, 11:25 AM

## 2020-10-20 NOTE — Op Note (Signed)
NAME: TERREL, NESHEIWAT MEDICAL RECORD NO: [id number] ACCOUNT NO: [id number] DATE OF BIRTH: 02/15/1951 FACILITY: [id number] PHYSICIAN: Lind Guest. Ninfa Linden, MD  Operative Report   DATE OF PROCEDURE: 10/19/2020  PREOPERATIVE DIAGNOSIS:  Primary osteoarthritis and degenerative joint disease, left knee.  POSTOPERATIVE DIAGNOSIS:  Primary osteoarthritis and degenerative joint disease, left knee.  PROCEDURE:  Left total knee arthroplasty.  IMPLANTS:  Stryker Triathlon press fit knee system with size 5 femur, size 5 tibial tray, 11 mm fixed bearing polyethylene insert, size 32 patellar button.  SURGEON:  Lind Guest. Ninfa Linden, MD  ASSISTANT:  Erskine Emery, PA-C.  ANESTHESIA: 1.  Left lower extremity adductor canal block. 2.  Spinal. 3.  Local with 0.25% Marcaine with epinephrine around the arthrotomy.  ANTIBIOTICS:  2 g IV Ancef.  ESTIMATED BLOOD LOSS:  Less than 100 mL  TOURNIQUET TIME:  Less than 1 hour.  COMPLICATIONS:  None.  INDICATIONS:  The patient is a 70 year old active gentleman with debilitating arthritis involving his left knee.  He has had at least two arthroscopic interventions over the years with that knee.  He has had multiple steroid injections and hyaluronic  acid injections.  At this point, his pain is daily with the left knee.  It is detrimentally effecting his mobility, his quality of life and his activities of daily living.  Although he does have a BMI of 40 he has lost significant weight in preparation  for surgery and trying to do anything he can to get his knee better.  At this point, he does wish to proceed with a total knee arthroplasty and we have recommended that and agreed to it as well.  He understands that there is a risk of acute blood loss  anemia, nerve or vessel injury, fracture, infection, DVT, and implant failure.  He understands our goals are to decrease pain and improve mobility and overall improve quality of  life.  DESCRIPTION OF PROCEDURE:  After informed consent was obtained appropriate left knee was marked.  An adductor canal block of the left lower extremity was obtained in the holding room.  He was then brought to the operating room, sat up on the operating  table where spinal anesthesia was obtained.  He was laid in supine position on the operating table.  A Foley catheter was placed and a nonsterile tourniquet was placed around his upper left thigh.  His left thigh knee, leg, and ankle were prepped and  draped with DuraPrep and sterile drapes including a sterile stockinette.  A timeout was called and he was identified as correct patient, correct left knee.  We then used the Esmarch to wrap out the leg and the tourniquet was inflated to 300 mm pressure.   I then made an incision over the patella and carried this proximally and distally.  I dissected down to the knee joint and carried out a medial parapatellar arthrotomy.  Finding a moderate joint effusion and significant arthritis in all 3 compartments  of his knee with areas of full-thickness cartilage loss.  With the knee in the flexed position, we removed periarticular osteophytes around the knee.  We removed the ACL, PCL, medial and lateral meniscus.  Using extramedullary cutting guide for making  our proximal tibia cut we set this cut for taking 9 mm off the high side and correcting the varus and valgus in the neutral slope.  We made this cut without difficulty.  We then used the intramedullary guide for the femur to make our distal femoral  cut  for a left knee at 5 degrees externally rotated and 8 mm distal femoral cut.  We made this cut without difficulty, and brought the knee back down to full extension and with a 9 mm extension block achieved full extension.  We went back to the femur and  put our femoral sizing guide based off the epicondylar axis.  Based off of this, we chose a size 5 femur.  We put a 4-in-1 cutting block for a size 5 femur,  made our anterior and posterior cuts, followed by our chamfer cuts.  We then made our femoral box  cut.  Attention was then turned back to the tibia.  We chose a size 5 tibial tray for coverage selecting the rotation of the tibial tubercle and the femur.  Based off of this we did a press-fit keel punch into the tibia.  With the size 5 tibial trial  tray followed by the size 5 left trial femur we trialed a 9 mm polyethylene insert.  We were pleased with the range of motion and stability of that knee, but we did feel like the final insert would need to be 11 mm thickness.  We then made our patellar  cut and drilled three holes for a press-fit size 32 patellar button.  We then removed all instrumentation from the knee and irrigated the knee with normal saline solution using pulsatile lavage.  We dried the knee real well and then placed our Marcaine  with epinephrine around the arthrotomy.  We then dried the knee again and with the knee in a flexed position, we placed our real Stryker press-fit tibial tray, size 5 followed by a real press-fit size 5 left femur.  We placed our real 11 mm fixed bearing  polyethylene insert and press fit our size 32 patellar button.  I put the knee through several cycles of range of motion.  I was pleased with the range of motion and stability on exam.  We then let the tourniquet down.  Hemostasis was obtained with  electrocautery.  We then closed the arthrotomy with interrupted #1 Vicryl suture.  0 Vicryl suture was used to close the deep tissue and 2-0 Vicryl was used to close subcutaneous tissue.  The skin was reapproximated with staples.  Xeroform well-padded  sterile dressing was applied.  The patient was taken to recovery room in stable condition with all final counts being correct and no complications noted.  Of note, Erskine Emery, PA-C, assisted during the entire case and his assistance was crucial for  facilitating all aspects of this case.   PUS D: 10/19/2020 2:17:40  pm T: 10/20/2020 4:18:00 am  JOB: [id number]/ [id number]

## 2020-10-20 NOTE — TOC Progression Note (Signed)
Transition of Care [facility]) - Progression Note    Patient Details  Name: Bryan Wang MRN: [id number] Date of Birth: Jan 18, 1951  [facility] CM/SW Contact  Joaquin Courts, RN Phone Number: 10/20/2020, 10:57 AM  Clinical Narrative:    Patient plans to dc home with HHPT with [facility].  [employer] to deliver rolling walker and 3in1 to bedside for home use.  Expected Discharge Plan: (P) [place] Barriers to Discharge: (P) No Barriers Identified  Expected Discharge Plan and Services Expected Discharge Plan: (P) [place]   Discharge Planning Services: (P) CM Consult Post Acute Care Choice: (P) [place] arrangements for the past 2 months: (P) Single Family Home                 DME Arranged: (P) [place] Rile rolling,3-N-1 DME Agency: (P) Other - Comment ([employer]) Date DME Agency Contacted: (P) 10/20/20 Time DME Agency Contacted: (P) 1046 Representative spoke with at DME Agency: (P) [place] HH Arranged: (P) PT [place]: (P) [facility]     Representative spoke with at [place]: (P) pre arranged in md office   Social Determinants of Health (SDOH) Interventions    Readmission Risk Interventions No flowsheet data found.

## 2020-10-20 NOTE — Plan of Care (Signed)
  Problem: Education: Goal: Knowledge of General Education information will improve Description: Including pain rating scale, medication(s)/side effects and non-pharmacologic comfort measures Outcome: Progressing   Problem: Pain Managment: Goal: General experience of comfort will improve Outcome: Progressing   

## 2020-10-20 NOTE — Evaluation (Signed)
Physical Therapy Evaluation Patient Details Name: Bryan Wang MRN: [id number] DOB: 23-Sep-1950 Today's Date: 10/20/2020   History of Present Illness  Pt s/p L TKR and with hx of liver disease, DM, and ADD  Clinical Impression  Pt s/p L TKR and presents with decreased L LE strength/ROM and post op pain limiting functional mobility.  Pt should progress to dc home with family assist and HHPT follow up.    Follow Up Recommendations Home health PT    Equipment Recommendations  Rolling walker with 5" wheels;3in1 (PT)    Recommendations for Other Services       Precautions / Restrictions Precautions Precautions: Fall;Knee Required Braces or Orthoses: Knee Immobilizer - Left Knee Immobilizer - Left: Discontinue once straight leg raise with < 10 degree lag Restrictions Weight Bearing Restrictions: No LLE Weight Bearing: Weight bearing as tolerated      Mobility  Bed Mobility Overal bed mobility: Needs Assistance Bed Mobility: Supine to Sit     Supine to sit: Min assist     General bed mobility comments: cues for sequence and use of R LE to self assist.  Use of bedrails to bring trunk to upright and min assist for L LE managment    Transfers Overall transfer level: Needs assistance Equipment used: Rolling walker (2 wheeled) Transfers: Sit to/from Stand Sit to Stand: Min assist;From elevated surface         General transfer comment: cues for LE management and use of UEs  Ambulation/Gait Ambulation/Gait assistance: Min assist;Min guard Gait Distance (Feet): 60 Feet Assistive device: Rolling walker (2 wheeled) Gait Pattern/deviations: Step-to pattern;Decreased step length - right;Decreased step length - left;Shuffle;Trunk flexed Gait velocity: decr   General Gait Details: cues for sequence, posture and position from [employer]            Wheelchair Mobility    Modified Rankin (Stroke Patients Only)       Balance Overall balance assessment: Needs  assistance Sitting-balance support: No upper extremity supported;Feet supported Sitting balance-Leahy Scale: Good     Standing balance support: Bilateral upper extremity supported Standing balance-Leahy Scale: Poor                               Pertinent Vitals/Pain Pain Assessment: 0-10 Pain Score: 7  Pain Location: L knee/thigh Pain Descriptors / Indicators: Aching;Sore Pain Intervention(s): Limited activity within patient's tolerance;Monitored during session;Premedicated before session;Ice applied    Home Living Family/patient expects to be discharged to:: Private residence Living Arrangements: Spouse/significant other Available Help at Discharge: Family Type of Home: House Home Access: Stairs to enter Entrance Stairs-Rails: Right;Left;Can reach both Technical brewer of Steps: 4 Home Layout: One level Home Equipment: Cane - single point      Prior Function Level of Independence: Independent with assistive device(s)         Comments: used cane as needed     Hand Dominance        Extremity/Trunk Assessment   Upper Extremity Assessment Upper Extremity Assessment: Overall WFL for tasks assessed    Lower Extremity Assessment Lower Extremity Assessment: LLE deficits/detail LLE Deficits / Details: AAROM at knee - 5 - 45; 2/5 quads    Cervical / Trunk Assessment Cervical / Trunk Assessment: Normal  Communication   Communication: No difficulties  Cognition Arousal/Alertness: Awake/alert Behavior During Therapy: WFL for tasks assessed/performed Overall Cognitive Status: Within Functional Limits for tasks assessed  General Comments      Exercises Total Joint Exercises Ankle Circles/Pumps: AROM;Both;15 reps;Supine Quad Sets: AROM;Both;10 reps;Supine Heel Slides: AAROM;Left;10 reps;Supine Straight Leg Raises: AAROM;Left;10 reps;Supine   Assessment/Plan    PT Assessment Patient  needs continued PT services  PT Problem List Decreased strength;Decreased range of motion;Decreased activity tolerance;Decreased balance;Decreased mobility;Decreased knowledge of use of DME;Pain;Obesity       PT Treatment Interventions Gait training;Stair training;Therapeutic activities;Therapeutic exercise;Functional mobility training;Balance training;Patient/family education    PT Goals (Current goals can be found in the Care Plan section)  Acute Rehab PT Goals Patient Stated Goal: Regain IND PT Goal Formulation: With patient Time For Goal Achievement: 10/27/20 Potential to Achieve Goals: Good    Frequency 7X/week   Barriers to discharge        Co-evaluation               AM-PAC PT "6 Clicks" Mobility  Outcome Measure Help needed turning from your back to your side while in a flat bed without using bedrails?: A Little Help needed moving from lying on your back to sitting on the side of a flat bed without using bedrails?: A Little Help needed moving to and from a bed to a chair (including a wheelchair)?: A Little Help needed standing up from a chair using your arms (e.g., wheelchair or bedside chair)?: A Little Help needed to walk in hospital room?: A Little Help needed climbing 3-5 steps with a railing? : A Lot 6 Click Score: 17    End of Session Equipment Utilized During Treatment: Gait belt;Left knee immobilizer Activity Tolerance: Patient tolerated treatment well Patient left: in chair;with call bell/phone within reach;with family/visitor present Nurse Communication: Mobility status PT Visit Diagnosis: Difficulty in walking, not elsewhere classified (R26.2);Pain Pain - Right/Left: Left Pain - part of body: Knee    Time: 7510-2585 PT Time Calculation (min) (ACUTE ONLY): 35 min   Charges:   PT Evaluation $PT Eval Low Complexity: 1 Low PT Treatments $Therapeutic Exercise: 8-22 mins        Debe Coder PT Acute Rehabilitation Services Pager  [phone] Office [phone]   [facility] 10/20/2020, 12:35 PM

## 2020-10-20 NOTE — Progress Notes (Signed)
Patient stated that he does not wear cpap any longer due to weight loss. Encouraged patient to call if he changes  his mind.

## 2020-10-21 DIAGNOSIS — M1712 Unilateral primary osteoarthritis, left knee: Secondary | ICD-10-CM | POA: Diagnosis not present

## 2020-10-21 NOTE — Progress Notes (Signed)
  Subjective: Bryan Wang is a 70 y.o. male s/p left TKA.  They are POD2.  Pt's pain is controlled. Pt has ambulated with some difficulty.  Ambulated 80 feet in PT.  Has not done any stair training yet.  Passing gas without issue. Denies nausea, lightheadedness/dizziness.  Objective: Vital signs in last 24 hours: Temp:  [97.8 F (36.6 C)-98.2 F (36.8 C)] 97.8 F (36.6 C) (03/06 0438) Pulse Rate:  [70-90] 90 (03/06 0438) Resp:  [16-17] 16 (03/06 0438) BP: (115-124)/(64-83) 124/83 (03/06 0438) SpO2:  [98 %-100 %] 98 % (03/06 0438)  Intake/Output from previous day: 03/05 0701 - 03/06 0700 In: 1217.5 [P.O.:1200; I.V.:17.5] Out: 1500 [Urine:1500] Intake/Output this shift: Total I/O In: 240 [P.O.:240] Out: 200 [Urine:200]  Exam:  No gross blood or drainage overlying the dressing Left foot warm and well-perfused Sensation intact distally in the left foot Able to dorsiflex and plantarflex the left foot   Labs: Recent Labs    10/20/20 0323  HGB 13.8   Recent Labs    10/20/20 0323  WBC 16.9*  RBC 4.60  HCT 42.1  PLT 260   Recent Labs    10/20/20 0323  NA 134*  K 4.1  CL 100  CO2 26  BUN 16  CREATININE 0.99  GLUCOSE 197*  CALCIUM 8.7*   No results for input(s): LABPT, INR in the last 72 hours.  Assessment/Plan: Pt is POD2 s/p left TKA.    -Plan to discharge to home today or tomorrow pending patient's pain and PT eval. If he is able to do stairs this afternoon, will discharge home today  -WBAT with a walker   Kenetra Hildenbrand L Giovanni [place] 10/21/2020, 9:16 AM

## 2020-10-21 NOTE — Progress Notes (Signed)
Physical Therapy Treatment Patient Details Name: Bryan Wang MRN: [id number] DOB: 1950/09/13 Today's Date: 10/21/2020    History of Present Illness Pt s/p L TKR and with hx of liver disease, DM, and ADD    PT Comments    Pt continues cooperative and progressed to negotiate stairs with crutch and rail - spouse present and written instruction provided.  Reviewed don/doff KI with pt and spouse.   Follow Up Recommendations  Home health PT     Equipment Recommendations  Rolling walker with 5" wheels;3in1 (PT)    Recommendations for Other Services       Precautions / Restrictions Precautions Precautions: Fall;Knee Required Braces or Orthoses: Knee Immobilizer - Left Knee Immobilizer - Left: Discontinue once straight leg raise with < 10 degree lag Restrictions Weight Bearing Restrictions: No LLE Weight Bearing: Weight bearing as tolerated    Mobility  Bed Mobility Overal bed mobility: Needs Assistance Bed Mobility: Supine to Sit;Sit to Supine     Supine to sit: Min assist Sit to supine: Min assist   General bed mobility comments: cues for sequence and min assist to manage L LE and to bring trunk to upright    Transfers Overall transfer level: Needs assistance Equipment used: Rolling walker (2 wheeled) Transfers: Sit to/from Stand Sit to Stand: From elevated surface;Supervision         General transfer comment: cues for LE management and use of UEs  Ambulation/Gait Ambulation/Gait assistance: Min guard Gait Distance (Feet): 50 Feet Assistive device: Rolling walker (2 wheeled) Gait Pattern/deviations: Step-to pattern;Decreased step length - right;Decreased step length - left;Shuffle;Trunk flexed Gait velocity: decr   General Gait Details: cues for sequence, posture and position from RW   Stairs Stairs: Yes Stairs assistance: Min assist Stair Management: One rail Right;Step to pattern;Forwards;With crutches Number of Stairs: 3 General stair comments:  cues for sequence and foot/crutch placement.  Bil rails attempted but pt unable to sufficiently offload wt from L LE to step up with R - much improved with use of crutch   Wheelchair Mobility    Modified Rankin (Stroke Patients Only)       Balance Overall balance assessment: Needs assistance Sitting-balance support: No upper extremity supported;Feet supported Sitting balance-Leahy Scale: Good     Standing balance support: No upper extremity supported Standing balance-Leahy Scale: Fair                              Cognition Arousal/Alertness: Awake/alert Behavior During Therapy: WFL for tasks assessed/performed Overall Cognitive Status: Within Functional Limits for tasks assessed                                        Exercises      General Comments        Pertinent Vitals/Pain Pain Assessment: 0-10 Pain Score: 4  Pain Location: L knee/thigh Pain Descriptors / Indicators: Aching;Sore Pain Intervention(s): Limited activity within patient's tolerance;Monitored during session;Premedicated before session;Ice applied    Home Living                      Prior Function            PT Goals (current goals can now be found in the care plan section) Acute Rehab PT Goals Patient Stated Goal: Regain IND PT Goal Formulation: With patient Time For Goal Achievement:  10/27/20 Potential to Achieve Goals: Good Progress towards PT goals: Progressing toward goals    Frequency    7X/week      PT Plan Current plan remains appropriate    Co-evaluation              AM-PAC PT "6 Clicks" Mobility   Outcome Measure  Help needed turning from your back to your side while in a flat bed without using bedrails?: A Little Help needed moving from lying on your back to sitting on the side of a flat bed without using bedrails?: A Little Help needed moving to and from a bed to a chair (including a wheelchair)?: A Little Help needed standing  up from a chair using your arms (e.g., wheelchair or bedside chair)?: A Little Help needed to walk in hospital room?: A Little Help needed climbing 3-5 steps with a railing? : A Little 6 Click Score: 18    End of Session Equipment Utilized During Treatment: Gait belt;Left knee immobilizer Activity Tolerance: Patient tolerated treatment well Patient left: in bed;with call bell/phone within reach;with family/visitor present Nurse Communication: Mobility status PT Visit Diagnosis: Difficulty in walking, not elsewhere classified (R26.2);Pain Pain - Right/Left: Left Pain - part of body: Knee     Time: 0947-0962 PT Time Calculation (min) (ACUTE ONLY): 33 min  Charges:  $Gait Training: 8-22 mins $Therapeutic Activity: 8-22 mins                     [place] Pager [phone] Office [phone]    [name] 10/21/2020, 1:20 PM

## 2020-10-21 NOTE — Progress Notes (Signed)
Physical Therapy Treatment Patient Details Name: Bryan Wang MRN: [id number] DOB: 09/15/1950 Today's Date: 10/21/2020    History of Present Illness Pt s/p L TKR and with hx of liver disease, DM, and ADD    PT Comments    Pt very cooperative and with Improved pain control but progressing slowly with mobility - very shaky with activity.   Follow Up Recommendations  Home health PT     Equipment Recommendations  Rolling walker with 5" wheels;3in1 (PT)    Recommendations for Other Services       Precautions / Restrictions Precautions Precautions: Fall;Knee Required Braces or Orthoses: Knee Immobilizer - Left Knee Immobilizer - Left: Discontinue once straight leg raise with < 10 degree lag Restrictions Weight Bearing Restrictions: No LLE Weight Bearing: Weight bearing as tolerated    Mobility  Bed Mobility Overal bed mobility: Needs Assistance Bed Mobility: Supine to Sit     Supine to sit: Min assist     General bed mobility comments: cues for sequence and use of R LE to self assist.  Use of bedrails to bring trunk to upright and min assist for L LE managment    Transfers Overall transfer level: Needs assistance Equipment used: Rolling walker (2 wheeled) Transfers: Sit to/from Stand Sit to Stand: From elevated surface;Min guard         General transfer comment: cues for LE management and use of UEs  Ambulation/Gait Ambulation/Gait assistance: Min assist;Min guard Gait Distance (Feet): 80 Feet Assistive device: Rolling walker (2 wheeled) Gait Pattern/deviations: Step-to pattern;Decreased step length - right;Decreased step length - left;Shuffle;Trunk flexed Gait velocity: decr   General Gait Details: cues for sequence, posture and position from [employer]             Wheelchair Mobility    Modified Rankin (Stroke Patients Only)       Balance Overall balance assessment: Needs assistance Sitting-balance support: No upper extremity  supported;Feet supported Sitting balance-Leahy Scale: Good     Standing balance support: Bilateral upper extremity supported Standing balance-Leahy Scale: Poor                              Cognition Arousal/Alertness: Awake/alert Behavior During Therapy: WFL for tasks assessed/performed Overall Cognitive Status: Within Functional Limits for tasks assessed                                        Exercises Total Joint Exercises Ankle Circles/Pumps: AROM;Both;15 reps;Supine Quad Sets: AROM;Both;Supine;5 reps Heel Slides: AAROM;Left;10 reps;Supine Straight Leg Raises: AAROM;Left;Supine;5 reps    General Comments        Pertinent Vitals/Pain Pain Assessment: 0-10 Pain Score: 5  Pain Location: L knee/thigh Pain Descriptors / Indicators: Aching;Sore Pain Intervention(s): Limited activity within patient's tolerance;Monitored during session;Premedicated before session;Ice applied    Home Living                      Prior Function            PT Goals (current goals can now be found in the care plan section) Acute Rehab PT Goals Patient Stated Goal: Regain IND PT Goal Formulation: With patient Time For Goal Achievement: 10/27/20 Potential to Achieve Goals: Good Progress towards PT goals: Progressing toward goals    Frequency    7X/week  PT Plan Current plan remains appropriate    Co-evaluation              AM-PAC PT "6 Clicks" Mobility   Outcome Measure  Help needed turning from your back to your side while in a flat bed without using bedrails?: A Little Help needed moving from lying on your back to sitting on the side of a flat bed without using bedrails?: A Little Help needed moving to and from a bed to a chair (including a wheelchair)?: A Little Help needed standing up from a chair using your arms (e.g., wheelchair or bedside chair)?: A Little Help needed to walk in hospital room?: A Little Help needed climbing  3-5 steps with a railing? : A Lot 6 Click Score: 17    End of Session Equipment Utilized During Treatment: Gait belt;Left knee immobilizer Activity Tolerance: Patient tolerated treatment well Patient left: in chair;with call bell/phone within reach;with family/visitor present Nurse Communication: Mobility status PT Visit Diagnosis: Difficulty in walking, not elsewhere classified (R26.2);Pain Pain - Right/Left: Left Pain - part of body: Knee     Time: 0811-0848 PT Time Calculation (min) (ACUTE ONLY): 37 min  Charges:  $Gait Training: 8-22 mins $Therapeutic Exercise: 8-22 mins                     [place] Pager [phone] Office [phone]    Bryan Wang 10/21/2020, 8:57 AM

## 2020-10-21 NOTE — Plan of Care (Signed)
  Problem: Activity: Goal: Risk for activity intolerance will decrease Outcome: Progressing   Problem: Pain Managment: Goal: General experience of comfort will improve Outcome: Progressing   Problem: Safety: Goal: Ability to remain free from injury will improve Outcome: Progressing   

## 2020-10-21 NOTE — Plan of Care (Signed)
Patient discharged, all care plans completed  

## 2020-10-22 ENCOUNTER — Encounter (HOSPITAL_COMMUNITY): Payer: Self-pay | Admitting: Orthopaedic Surgery

## 2020-10-26 ENCOUNTER — Other Ambulatory Visit: Payer: Self-pay | Admitting: Orthopaedic Surgery

## 2020-10-26 MED ORDER — OXYCODONE HCL 5 MG PO TABS: 5.0000 mg | ORAL_TABLET | ORAL | 0 refills | Status: DC | PRN

## 2020-10-26 NOTE — Addendum Note (Signed)
Addendum  created 10/26/20 1333 by Duane Boston, MD   Clinical Note Signed, Intraprocedure Blocks edited

## 2020-10-27 ENCOUNTER — Encounter: Payer: Self-pay | Admitting: Orthopaedic Surgery

## 2020-10-29 ENCOUNTER — Telehealth: Payer: Self-pay | Admitting: Orthopaedic Surgery

## 2020-10-29 NOTE — Telephone Encounter (Signed)
Medical records release form, disability form and payment received from fron desk    Forwarding to [facility]

## 2020-11-01 ENCOUNTER — Ambulatory Visit (INDEPENDENT_AMBULATORY_CARE_PROVIDER_SITE_OTHER): Payer: Medicare Other | Admitting: Physician Assistant

## 2020-11-01 ENCOUNTER — Other Ambulatory Visit: Payer: Self-pay

## 2020-11-01 ENCOUNTER — Encounter: Payer: Self-pay | Admitting: Physician Assistant

## 2020-11-01 DIAGNOSIS — Z96652 Presence of left artificial knee joint: Secondary | ICD-10-CM

## 2020-11-01 MED ORDER — OXYCODONE HCL 5 MG PO TABS: 5.0000 mg | ORAL_TABLET | ORAL | 0 refills | Status: DC | PRN

## 2020-11-01 NOTE — Progress Notes (Signed)
HPI: Bryan Wang is 2 weeks status post left total knee arthroplasty.  He is overall doing well.  He has no complaints.  He is taking aspirin 81 mg twice daily.  He was on no aspirin prior to surgery.  He is still taking oxycodone for pain.  He has had no shortness of breath fevers chills.  Physical exam: Left knee has full extension and flexion to 90 degrees.  Surgical incisions well approximated with staples no signs of infection.  Calf supple nontender.  Ambulates with a rolling walker.  Impression: Status post left total knee arthroplasty 10/19/2020  Plan: Staples removed Steri-Strips applied.  He will work on scar tissue mobilization.  80 set up for outpatient physical therapy at [facility] in [place].  He will continue taking 81 mg aspirin once daily for another week and then discontinue.  Refill on oxycodone was given.  He will follow-up with [name] in 4 weeks sooner if there is any questions or concerns.  Questions were encouraged and answered at length today.

## 2020-11-06 ENCOUNTER — Ambulatory Visit (INDEPENDENT_AMBULATORY_CARE_PROVIDER_SITE_OTHER): Payer: Medicare Other | Admitting: Family Medicine

## 2020-11-06 ENCOUNTER — Encounter (INDEPENDENT_AMBULATORY_CARE_PROVIDER_SITE_OTHER): Payer: Self-pay | Admitting: Family Medicine

## 2020-11-06 ENCOUNTER — Other Ambulatory Visit: Payer: Self-pay

## 2020-11-06 VITALS — BP 115/67 | HR 104 | Temp 97.4°F | Ht 68.0 in | Wt 251.0 lb

## 2020-11-06 DIAGNOSIS — Z794 Long term (current) use of insulin: Secondary | ICD-10-CM

## 2020-11-06 DIAGNOSIS — E1159 Type 2 diabetes mellitus with other circulatory complications: Secondary | ICD-10-CM | POA: Diagnosis not present

## 2020-11-06 DIAGNOSIS — Z6841 Body Mass Index (BMI) 40.0 and over, adult: Secondary | ICD-10-CM

## 2020-11-06 DIAGNOSIS — I152 Hypertension secondary to endocrine disorders: Secondary | ICD-10-CM

## 2020-11-06 DIAGNOSIS — E1169 Type 2 diabetes mellitus with other specified complication: Secondary | ICD-10-CM | POA: Diagnosis not present

## 2020-11-07 ENCOUNTER — Telehealth: Payer: Self-pay | Admitting: Orthopaedic Surgery

## 2020-11-07 NOTE — Telephone Encounter (Signed)
Patient states "small opening" at the top of incision yesterday at PT, I told him to have them take a look at it at his appt today and they will know if I looks worrisome

## 2020-11-07 NOTE — Telephone Encounter (Signed)
Returned call, told him he could leave message with receptionist

## 2020-11-07 NOTE — Telephone Encounter (Signed)
Pt called stating he has a few questions about his incision you spoke with him about earlier today

## 2020-11-07 NOTE — Telephone Encounter (Signed)
Patient states that his incision opened a little at the top while doing physical therapy on 11/06/20. It does bleed a little and he was wondering if he should continue with PT today @ 3:00pm.    Patients call back # [phone]

## 2020-11-13 NOTE — Progress Notes (Signed)
Chief Complaint:   OBESITY Bryan Wang is here to discuss his progress with his obesity treatment plan along with follow-up of his obesity related diagnoses.   Today's visit was #: 14 Starting weight: 299 lbs Starting date: 04/03/2020 Today's weight: 251 lbs Today's date: 11/06/2020 Total lbs lost to date: 48 lbs Body mass index is 38.16 kg/m.  Total weight loss percentage to date: -16.05%  Interim History:  Bryan Wang had knee surgery on 10/19/2020.  His last office visit was with me on 10/15/2020.  He had decreased appetite and did not eat much for awhile (except chicken soup).  Now back to normal.  Denies issues or concerns with the plan.  Current Meal Plan: the Category 2 Plan for 0% of the time.  Current Exercise Plan: Physical Therapy for 30 minutes 5 times per week.  Assessment/Plan:    1. Type 2 diabetes mellitus with other specified complication, with long-term current use of insulin (HCC) Diabetes Mellitus: Not at goal. Medication: metformin 1,000 mg twice daily, glipizide 2.5 mg twice daily, and alogliptin 25 mg daily. Issues reviewed: blood sugar goals, complications of diabetes mellitus, hypoglycemia prevention and treatment, exercise, and nutrition. FBS highest 125, lowest 84.  Plan: The importance of regular follow up with PCP and all other specialists as scheduled was stressed to patient today.  Continue glipizide, metformin, and alogliptin.  Continue prudent nutritional plan and weight loss.  Hypoglycemia prevention discussed today.  Lab Results  Component Value Date   HGBA1C 6.0 (H) 10/12/2020   HGBA1C 5.9 (H) 08/01/2020   HGBA1C 7.4 05/03/2020   HGBA1C 7.4 05/03/2020   Lab Results  Component Value Date   LDLCALC 51 08/01/2020   CREATININE 0.99 10/20/2020   2. Hypertension associated with type 2 diabetes mellitus ([place]) At goal. Medications:  HCTZ 25 mg daily and lisinopril 2.5 mg daily.  Denies low blood pressures but occasionally lightheaded after surgery.  That  has now resolved.  Plan:  At goal.  Continue medications.  Avoid buying foods that are: processed, frozen, or prepackaged to avoid excess salt. We will continue to monitor closely alongside his PCP and/or Specialist.  Regular follow up with PCP and specialists was also encouraged.   BP Readings from Last 3 Encounters:  11/06/20 115/67  10/21/20 124/83  10/15/20 127/67   Lab Results  Component Value Date   CREATININE 0.99 10/20/2020   3. Obesity, current BMI 38.3  Course: Bryan Wang is currently in the action stage of change. As such, his goal is to continue with weight loss efforts.   Nutrition goals: He has agreed to the Category 2 Plan.   Exercise goals: As is.  Behavioral modification strategies: increasing lean protein intake, no skipping meals and planning for success.  Bryan Wang has agreed to follow-up with our clinic in 2-3 weeks. He was informed of the importance of frequent follow-up visits to maximize his success with intensive lifestyle modifications for his multiple health conditions.   Objective:   Blood pressure 115/67, pulse (!) 104, temperature (!) 97.4 F (36.3 C), height 5\' 8"  (1.727 m), weight 251 lb (113.9 kg), SpO2 98 %. Body mass index is 38.16 kg/m.  General: Cooperative, alert, well developed, in no acute distress. HEENT: Conjunctivae and lids unremarkable. Cardiovascular: Regular rhythm.  Lungs: Normal work of breathing. Neurologic: No focal deficits.   Lab Results  Component Value Date   CREATININE 0.99 10/20/2020   BUN 16 10/20/2020   NA 134 (L) 10/20/2020   K 4.1 10/20/2020  CL 100 10/20/2020   CO2 26 10/20/2020   Lab Results  Component Value Date   ALT 43 08/01/2020   AST 31 08/01/2020   ALKPHOS 60 08/01/2020   BILITOT 0.7 08/01/2020   Lab Results  Component Value Date   HGBA1C 6.0 (H) 10/12/2020   HGBA1C 5.9 (H) 08/01/2020   HGBA1C 7.4 05/03/2020   HGBA1C 7.4 05/03/2020   HGBA1C 8.5 (H) 04/03/2020   Lab Results  Component Value  Date   INSULIN 50.7 (H) 04/03/2020   Lab Results  Component Value Date   TSH 3.860 04/03/2020   Lab Results  Component Value Date   CHOL 110 08/01/2020   HDL 41 08/01/2020   LDLCALC 51 08/01/2020   TRIG 92 08/01/2020   CHOLHDL 2.7 08/01/2020   Lab Results  Component Value Date   WBC 16.9 (H) 10/20/2020   HGB 13.8 10/20/2020   HCT 42.1 10/20/2020   MCV 91.5 10/20/2020   PLT 260 10/20/2020   Lab Results  Component Value Date   IRON 51 08/01/2020   TIBC 253 08/01/2020   FERRITIN 147 08/01/2020   Obesity Behavioral Intervention:   Approximately 15 minutes were spent on the discussion below.  ASK: We discussed the diagnosis of obesity with Bryan Wang today and Bryan Wang agreed to give [name] permission to discuss obesity behavioral modification therapy today.  ASSESS: Bryan Wang has the diagnosis of obesity and his BMI today is 38.3. Bryan Wang is in the action stage of change.   ADVISE: Bryan Wang was educated on the multiple health risks of obesity as well as the benefit of weight loss to improve his health. He was advised of the need for long term treatment and the importance of lifestyle modifications to improve his current health and to decrease his risk of future health problems.  AGREE: Multiple dietary modification options and treatment options were discussed and Bryan Wang agreed to follow the recommendations documented in the above note.  ARRANGE: Bryan Wang was educated on the importance of frequent visits to treat obesity as outlined per CMS and USPSTF guidelines and agreed to schedule his next follow up appointment today.  Attestation Statements:   Reviewed by clinician on day of visit: allergies, medications, problem list, medical history, surgical history, family history, social history, and previous encounter notes.  Bryan Wang, Water quality scientist, CMA, am acting as Location manager for [employer], DO.  Bryan Wang have reviewed the above documentation for accuracy and completeness, and Bryan Wang agree with the  above. Marjory Sneddon, D.O.  The [place] was signed into law in 2016 which includes the topic of electronic health records.  This provides immediate access to information in MyChart.  This includes consultation notes, operative notes, office notes, lab results and pathology reports.  If you have any questions about what you read please let us know at your next visit so we can discuss your concerns and take corrective action if need be.  We are right here with you.

## 2020-11-27 ENCOUNTER — Encounter (INDEPENDENT_AMBULATORY_CARE_PROVIDER_SITE_OTHER): Payer: Self-pay

## 2020-11-28 ENCOUNTER — Ambulatory Visit (INDEPENDENT_AMBULATORY_CARE_PROVIDER_SITE_OTHER): Payer: Medicare Other | Admitting: Family Medicine

## 2020-12-03 ENCOUNTER — Encounter: Payer: Self-pay | Admitting: Orthopaedic Surgery

## 2020-12-03 ENCOUNTER — Ambulatory Visit (INDEPENDENT_AMBULATORY_CARE_PROVIDER_SITE_OTHER): Payer: Medicare Other | Admitting: Orthopaedic Surgery

## 2020-12-03 DIAGNOSIS — Z96652 Presence of left artificial knee joint: Secondary | ICD-10-CM

## 2020-12-03 NOTE — Progress Notes (Signed)
The patient is 6 weeks status post a left knee replacement.  He is doing very well overall.  He says he is improved his range of motion and decrease his pain.  He does have still some swelling without left knee but is range of motion is entirely full and the knee feels stable to me.  He is very pleased overall as are we.  He will continue to increase his activities as comfort allows.  We will see him back in 6 months unless there is issues before then.  At that visit I like an AP and lateral of his left knee.  All questions and concerns were answered and addressed.

## 2020-12-20 ENCOUNTER — Ambulatory Visit (INDEPENDENT_AMBULATORY_CARE_PROVIDER_SITE_OTHER): Payer: Medicare Other | Admitting: Family Medicine

## 2021-01-01 ENCOUNTER — Ambulatory Visit (INDEPENDENT_AMBULATORY_CARE_PROVIDER_SITE_OTHER): Payer: Medicare Other | Admitting: Family Medicine

## 2021-01-11 ENCOUNTER — Emergency Department (HOSPITAL_COMMUNITY)
Admission: EM | Admit: 2021-01-11 | Discharge: 2021-01-11 | Disposition: A | Discharge status: Home / Self Care | Payer: No Typology Code available for payment source | Attending: Emergency Medicine | Admitting: Emergency Medicine

## 2021-01-11 ENCOUNTER — Other Ambulatory Visit: Payer: Self-pay

## 2021-01-11 DIAGNOSIS — Z85828 Personal history of other malignant neoplasm of skin: Secondary | ICD-10-CM | POA: Insufficient documentation

## 2021-01-11 DIAGNOSIS — Z96652 Presence of left artificial knee joint: Secondary | ICD-10-CM | POA: Diagnosis not present

## 2021-01-11 DIAGNOSIS — W268XXA Contact with other sharp object(s), not elsewhere classified, initial encounter: Secondary | ICD-10-CM | POA: Diagnosis not present

## 2021-01-11 DIAGNOSIS — Z7982 Long term (current) use of aspirin: Secondary | ICD-10-CM | POA: Insufficient documentation

## 2021-01-11 DIAGNOSIS — S81812A Laceration without foreign body, left lower leg, initial encounter: Secondary | ICD-10-CM | POA: Diagnosis not present

## 2021-01-11 DIAGNOSIS — Z7984 Long term (current) use of oral hypoglycemic drugs: Secondary | ICD-10-CM | POA: Insufficient documentation

## 2021-01-11 DIAGNOSIS — Z87891 Personal history of nicotine dependence: Secondary | ICD-10-CM | POA: Diagnosis not present

## 2021-01-11 DIAGNOSIS — S8992XA Unspecified injury of left lower leg, initial encounter: Secondary | ICD-10-CM | POA: Diagnosis present

## 2021-01-11 DIAGNOSIS — Z79899 Other long term (current) drug therapy: Secondary | ICD-10-CM | POA: Insufficient documentation

## 2021-01-11 DIAGNOSIS — I10 Essential (primary) hypertension: Secondary | ICD-10-CM | POA: Insufficient documentation

## 2021-01-11 DIAGNOSIS — E119 Type 2 diabetes mellitus without complications: Secondary | ICD-10-CM | POA: Diagnosis not present

## 2021-01-11 MED ORDER — CEPHALEXIN 500 MG PO CAPS: 500.0000 mg | ORAL_CAPSULE | Freq: Two times a day (BID) | ORAL | 0 refills | Status: AC

## 2021-01-11 MED ORDER — LIDOCAINE-EPINEPHRINE (PF) 2 %-1:200000 IJ SOLN
10.0000 mL | Freq: Once | INTRAMUSCULAR | Status: AC
  Administered 2021-01-11: 10 mL
  Filled 2021-01-11: qty 20

## 2021-01-11 MED ORDER — BACITRACIN ZINC 500 UNIT/GM EX OINT
TOPICAL_OINTMENT | CUTANEOUS | Status: AC
  Administered 2021-01-11: 1 via TOPICAL
  Filled 2021-01-11: qty 0.9

## 2021-01-11 NOTE — ED Notes (Signed)
[facility] has a laceration to right shin from bumper-MD referring to ED for sutures due to hardware in knee for knee replacement
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
## 2021-01-11 NOTE — Discharge Instructions (Signed)
Sutured repair Keep the laceration site dry for the next 24 hours and leave the dressing in place. After 24 hours you may remove the dressing and gently clean the laceration site with antibacterial soap and warm water. Do not scrub the area. Do not soak the area and water for long periods of time. Don't use hydrogen peroxide, iodine-based solutions, or alcohol, which can slow healing, and will probably be painful! Apply topical bacitracin 1-2 times per day for the next 3-5 days. Return to the emergency department in 7-10 days for removal of the sutures.  You should return sooner for any signs of infection which would include increased redness around the wound, increased swelling, new drainage of yellow pus.

## 2021-01-22 ENCOUNTER — Ambulatory Visit (INDEPENDENT_AMBULATORY_CARE_PROVIDER_SITE_OTHER): Payer: Medicare Other | Admitting: Family Medicine

## 2021-01-22 ENCOUNTER — Other Ambulatory Visit: Payer: Self-pay

## 2021-01-22 ENCOUNTER — Encounter (INDEPENDENT_AMBULATORY_CARE_PROVIDER_SITE_OTHER): Payer: Self-pay | Admitting: Family Medicine

## 2021-01-22 VITALS — BP 107/78 | HR 120 | Temp 97.8°F | Ht 68.0 in | Wt 262.0 lb

## 2021-01-22 DIAGNOSIS — E1169 Type 2 diabetes mellitus with other specified complication: Secondary | ICD-10-CM | POA: Diagnosis not present

## 2021-01-22 DIAGNOSIS — E785 Hyperlipidemia, unspecified: Secondary | ICD-10-CM | POA: Diagnosis not present

## 2021-01-22 DIAGNOSIS — Z6841 Body Mass Index (BMI) 40.0 and over, adult: Secondary | ICD-10-CM

## 2021-01-22 DIAGNOSIS — G4709 Other insomnia: Secondary | ICD-10-CM | POA: Diagnosis not present

## 2021-01-22 MED ORDER — ATORVASTATIN CALCIUM 40 MG PO TABS: 40.0000 mg | ORAL_TABLET | Freq: Every day | ORAL | 0 refills | Status: AC

## 2021-01-30 NOTE — Progress Notes (Signed)
Chief Complaint:   OBESITY Bryan Wang is here to discuss his progress with his obesity treatment plan along with follow-up of his obesity related diagnoses.   Today's visit was #: 15 Starting weight: 299 lbs Starting date: 04/03/2020 Today's weight: 262 lbs Today's date: 01/22/2021 Weight change since last visit: +11 Total lbs lost to date: 37 lbs Body mass index is 39.84 kg/m.  Total weight loss percentage to date: -12.37%  Interim History: Bryan Wang went to [employer] on [place] and fell off the wagon and never got back on .  Poor choices made led to more poor choices.  Bryan Wang got rid of junk food in the house and is ready to get back on plan.  Current Meal Plan: the Category 2 Plan for 0% of the time.  Current Exercise Plan: Yard work for 60 minutes 3-4 times per week.  Assessment/Plan:   Medications Discontinued During This Encounter  Medication Reason   atorvastatin (LIPITOR) 40 MG tablet Reorder   Meds ordered this encounter  Medications   atorvastatin (LIPITOR) 40 MG tablet    Sig: Take 1 tablet (40 mg total) by mouth at bedtime.    Dispense:  30 tablet    Refill:  0    Ov for RF    1. Type 2 diabetes mellitus with other specified complication, without long-term current use of insulin (HCC) Diabetes Mellitus: Not at goal. Medication: glipizide 2.5 mg twice daily, metformin 1,000 twice daily. Issues reviewed: blood sugar goals, complications of diabetes mellitus, hypoglycemia prevention and treatment, exercise, and nutrition.  Highest blood sugar was 133.  No lows at all.  Lowest was 1005.  Plan: The importance of regular follow up with PCP and all other specialists as scheduled was stressed to patient today. The patient will continue to focus on protein-rich, low simple carbohydrate foods. We reviewed the importance of hydration, regular exercise for stress reduction, and restorative sleep.   Lab Results  Component Value Date   HGBA1C 6.0 (H) 10/12/2020   HGBA1C 5.9  (H) 08/01/2020   HGBA1C 7.4 05/03/2020   HGBA1C 7.4 05/03/2020   Lab Results  Component Value Date   LDLCALC 51 08/01/2020   CREATININE 0.99 10/20/2020   2. Hyperlipidemia associated with type 2 diabetes mellitus ([place]) Course: At goal. Lipid-lowering medications: Lipitor 40 mg daily.   Plan: Dietary changes: Increase soluble fiber, decrease simple carbohydrates, decrease saturated fat. Exercise changes: Moderate to vigorous-intensity aerobic activity 150 minutes per week or as tolerated. We will continue to monitor along with PCP/specialists as it pertains to his weight loss journey.  Lab Results  Component Value Date   CHOL 110 08/01/2020   HDL 41 08/01/2020   LDLCALC 51 08/01/2020   TRIG 92 08/01/2020   CHOLHDL 2.7 08/01/2020   Lab Results  Component Value Date   ALT 43 08/01/2020   AST 31 08/01/2020   ALKPHOS 60 08/01/2020   BILITOT 0.7 08/01/2020   - Refill atorvastatin (LIPITOR) 40 MG tablet; Take 1 tablet (40 mg total) by mouth at bedtime.  Dispense: 30 tablet; Refill: 0  3. Other insomnia This is poorly controlled. Current treatment: None.  Plan:  Okay to take melatonin 10 mg at bedtime if needed.  Recommend sleep hygiene measures including regular sleep schedule, optimal sleep environment, and relaxing presleep rituals.   4. Obesity, current BMI 39.9  Course: Bryan Wang is currently in the action stage of change. As such, his goal is to continue with weight loss efforts.  Nutrition goals: Bryan Wang has agreed to the Category 2 Plan.   Exercise goals:  As is.  Behavioral modification strategies: meal planning and cooking strategies, avoiding temptations, and planning for success.  Bryan Wang has agreed to follow-up with our clinic in 2 weeks. Bryan Wang was informed of the importance of frequent follow-up visits to maximize his success with intensive lifestyle modifications for his multiple health conditions.   Objective:   Blood pressure 107/78, pulse (!) 120, temperature 97.8 F  (36.6 C), height 5\' 8"  (1.727 m), weight 262 lb (118.8 kg), SpO2 98 %. Body mass index is 39.84 kg/m.  General: Cooperative, alert, well developed, in no acute distress. HEENT: Conjunctivae and lids unremarkable. Cardiovascular: Regular rhythm.  Lungs: Normal work of breathing. Neurologic: No focal deficits.   Lab Results  Component Value Date   CREATININE 0.99 10/20/2020   BUN 16 10/20/2020   NA 134 (L) 10/20/2020   K 4.1 10/20/2020   CL 100 10/20/2020   CO2 26 10/20/2020   Lab Results  Component Value Date   ALT 43 08/01/2020   AST 31 08/01/2020   ALKPHOS 60 08/01/2020   BILITOT 0.7 08/01/2020   Lab Results  Component Value Date   HGBA1C 6.0 (H) 10/12/2020   HGBA1C 5.9 (H) 08/01/2020   HGBA1C 7.4 05/03/2020   HGBA1C 7.4 05/03/2020   HGBA1C 8.5 (H) 04/03/2020   Lab Results  Component Value Date   INSULIN 50.7 (H) 04/03/2020   Lab Results  Component Value Date   TSH 3.860 04/03/2020   Lab Results  Component Value Date   CHOL 110 08/01/2020   HDL 41 08/01/2020   LDLCALC 51 08/01/2020   TRIG 92 08/01/2020   CHOLHDL 2.7 08/01/2020   Lab Results  Component Value Date   WBC 16.9 (H) 10/20/2020   HGB 13.8 10/20/2020   HCT 42.1 10/20/2020   MCV 91.5 10/20/2020   PLT 260 10/20/2020   Lab Results  Component Value Date   IRON 51 08/01/2020   TIBC 253 08/01/2020   FERRITIN 147 08/01/2020   Obesity Behavioral Intervention:   Approximately 15 minutes were spent on the discussion below.  ASK: We discussed the diagnosis of obesity with Bryan Wang today and Bryan Wang agreed to give [name] permission to discuss obesity behavioral modification therapy today.  ASSESS: Bryan Wang has the diagnosis of obesity and his BMI today is 39.9. Bryan Wang is in the action stage of change.   ADVISE: Bryan Wang was educated on the multiple health risks of obesity as well as the benefit of weight loss to improve his health. Bryan Wang was advised of the need for long term treatment and the importance of  lifestyle modifications to improve his current health and to decrease his risk of future health problems.  AGREE: Multiple dietary modification options and treatment options were discussed and Bryan Wang agreed to follow the recommendations documented in the above note.  ARRANGE: Bryan Wang was educated on the importance of frequent visits to treat obesity as outlined per CMS and USPSTF guidelines and agreed to schedule his next follow up appointment today.  Attestation Statements:   Reviewed by clinician on day of visit: allergies, medications, problem list, medical history, surgical history, family history, social history, and previous encounter notes.  Bryan Wang, Water quality scientist, CMA, am acting as Location manager for [employer], DO.  Bryan Wang have reviewed the above documentation for accuracy and completeness, and Bryan Wang agree with the above. Marjory Sneddon, D.O.  The 21st Century Cures Act was signed into law in 2016  which includes the topic of electronic health records.  This provides immediate access to information in MyChart.  This includes consultation notes, operative notes, office notes, lab results and pathology reports.  If you have any questions about what you read please let us know at your next visit so we can discuss your concerns and take corrective action if need be.  We are right here with you.

## 2021-02-13 ENCOUNTER — Other Ambulatory Visit: Payer: Self-pay

## 2021-02-13 ENCOUNTER — Other Ambulatory Visit (INDEPENDENT_AMBULATORY_CARE_PROVIDER_SITE_OTHER): Payer: Self-pay | Admitting: Family Medicine

## 2021-02-13 ENCOUNTER — Ambulatory Visit (INDEPENDENT_AMBULATORY_CARE_PROVIDER_SITE_OTHER): Payer: Medicare Other | Admitting: Family Medicine

## 2021-02-13 VITALS — BP 117/65 | HR 75 | Temp 97.9°F | Ht 68.0 in | Wt 266.0 lb

## 2021-02-13 DIAGNOSIS — E559 Vitamin D deficiency, unspecified: Secondary | ICD-10-CM

## 2021-02-13 DIAGNOSIS — E1169 Type 2 diabetes mellitus with other specified complication: Secondary | ICD-10-CM

## 2021-02-13 DIAGNOSIS — R42 Dizziness and giddiness: Secondary | ICD-10-CM | POA: Diagnosis not present

## 2021-02-13 DIAGNOSIS — Z6841 Body Mass Index (BMI) 40.0 and over, adult: Secondary | ICD-10-CM | POA: Diagnosis not present

## 2021-02-13 DIAGNOSIS — E1159 Type 2 diabetes mellitus with other circulatory complications: Secondary | ICD-10-CM

## 2021-02-13 DIAGNOSIS — R5383 Other fatigue: Secondary | ICD-10-CM | POA: Diagnosis not present

## 2021-02-13 DIAGNOSIS — I152 Hypertension secondary to endocrine disorders: Secondary | ICD-10-CM

## 2021-02-13 MED ORDER — HYDROCHLOROTHIAZIDE 12.5 MG PO TABS: 12.5000 mg | ORAL_TABLET | Freq: Every day | ORAL | Status: DC

## 2021-02-14 ENCOUNTER — Encounter (INDEPENDENT_AMBULATORY_CARE_PROVIDER_SITE_OTHER): Payer: Self-pay | Admitting: Family Medicine

## 2021-02-14 ENCOUNTER — Other Ambulatory Visit (INDEPENDENT_AMBULATORY_CARE_PROVIDER_SITE_OTHER): Payer: Self-pay | Admitting: Family Medicine

## 2021-02-14 DIAGNOSIS — I152 Hypertension secondary to endocrine disorders: Secondary | ICD-10-CM

## 2021-02-14 DIAGNOSIS — E1159 Type 2 diabetes mellitus with other circulatory complications: Secondary | ICD-10-CM

## 2021-02-14 LAB — COMPREHENSIVE METABOLIC PANEL
ALT: 25 IU/L (ref 0–44)
AST: 19 IU/L (ref 0–40)
Albumin/Globulin Ratio: 1.2 (ref 1.2–2.2)
Albumin: 4.1 g/dL (ref 3.8–4.8)
Alkaline Phosphatase: 65 IU/L (ref 44–121)
BUN/Creatinine Ratio: 20 (ref 10–24)
BUN: 17 mg/dL (ref 8–27)
Bilirubin Total: 0.5 mg/dL (ref 0.0–1.2)
CO2: 24 mmol/L (ref 20–29)
Calcium: 9.6 mg/dL (ref 8.6–10.2)
Chloride: 96 mmol/L (ref 96–106)
Creatinine, Ser: 0.85 mg/dL (ref 0.76–1.27)
Globulin, Total: 3.3 g/dL (ref 1.5–4.5)
Glucose: 120 mg/dL — ABNORMAL HIGH (ref 65–99)
Potassium: 4.3 mmol/L (ref 3.5–5.2)
Sodium: 139 mmol/L (ref 134–144)
Total Protein: 7.4 g/dL (ref 6.0–8.5)
eGFR: 93 mL/min/{1.73_m2} (ref >59)

## 2021-02-14 LAB — CBC WITH DIFFERENTIAL/PLATELET
Basophils Absolute: 0.1 10*3/uL (ref 0.0–0.2)
Basos: 1 %
EOS (ABSOLUTE): 0.1 10*3/uL (ref 0.0–0.4)
Eos: 1 %
Hematocrit: 44.4 % (ref 37.5–51.0)
Hemoglobin: 14.5 g/dL (ref 13.0–17.7)
Immature Grans (Abs): 0.1 10*3/uL (ref 0.0–0.1)
Immature Granulocytes: 1 %
Lymphocytes Absolute: 0.8 10*3/uL (ref 0.7–3.1)
Lymphs: 8 %
MCH: 30 pg (ref 26.6–33.0)
MCHC: 32.7 g/dL (ref 31.5–35.7)
MCV: 92 fL (ref 79–97)
Monocytes Absolute: 0.6 10*3/uL (ref 0.1–0.9)
Monocytes: 6 %
Neutrophils Absolute: 9.2 10*3/uL — ABNORMAL HIGH (ref 1.4–7.0)
Neutrophils: 83 %
Platelets: 303 10*3/uL (ref 150–450)
RBC: 4.84 x10E6/uL (ref 4.14–5.80)
RDW: 15.7 % — ABNORMAL HIGH (ref 11.6–15.4)
WBC: 10.9 10*3/uL — ABNORMAL HIGH (ref 3.4–10.8)

## 2021-02-14 LAB — HEMOGLOBIN A1C
Est. average glucose Bld gHb Est-mCnc: 126 mg/dL
Hgb A1c MFr Bld: 6 % — ABNORMAL HIGH (ref 4.8–5.6)

## 2021-02-14 LAB — TSH: TSH: 3.53 u[IU]/mL (ref 0.450–4.500)

## 2021-02-14 LAB — VITAMIN D 25 HYDROXY (VIT D DEFICIENCY, FRACTURES): Vit D, 25-Hydroxy: 23.3 ng/mL — ABNORMAL LOW (ref 30.0–100.0)

## 2021-02-14 MED ORDER — HYDROCHLOROTHIAZIDE 12.5 MG PO TABS: 12.5000 mg | ORAL_TABLET | Freq: Every day | ORAL | 0 refills | Status: AC

## 2021-02-14 NOTE — Telephone Encounter (Signed)
Dr.Opalski ?

## 2021-02-16 ENCOUNTER — Other Ambulatory Visit: Payer: Self-pay

## 2021-02-16 ENCOUNTER — Encounter (HOSPITAL_BASED_OUTPATIENT_CLINIC_OR_DEPARTMENT_OTHER): Payer: Self-pay | Admitting: *Deleted

## 2021-02-16 ENCOUNTER — Emergency Department (HOSPITAL_BASED_OUTPATIENT_CLINIC_OR_DEPARTMENT_OTHER)
Admission: EM | Admit: 2021-02-16 | Discharge: 2021-02-16 | Disposition: A | Discharge status: Home / Self Care | Payer: Medicare Other | Attending: Emergency Medicine | Admitting: Emergency Medicine

## 2021-02-16 DIAGNOSIS — E785 Hyperlipidemia, unspecified: Secondary | ICD-10-CM | POA: Diagnosis not present

## 2021-02-16 DIAGNOSIS — Y9389 Activity, other specified: Secondary | ICD-10-CM | POA: Insufficient documentation

## 2021-02-16 DIAGNOSIS — Z87891 Personal history of nicotine dependence: Secondary | ICD-10-CM | POA: Diagnosis not present

## 2021-02-16 DIAGNOSIS — Z85828 Personal history of other malignant neoplasm of skin: Secondary | ICD-10-CM | POA: Diagnosis not present

## 2021-02-16 DIAGNOSIS — I10 Essential (primary) hypertension: Secondary | ICD-10-CM | POA: Diagnosis not present

## 2021-02-16 DIAGNOSIS — Y92009 Unspecified place in unspecified non-institutional (private) residence as the place of occurrence of the external cause: Secondary | ICD-10-CM | POA: Insufficient documentation

## 2021-02-16 DIAGNOSIS — Z96652 Presence of left artificial knee joint: Secondary | ICD-10-CM | POA: Diagnosis not present

## 2021-02-16 DIAGNOSIS — Z79899 Other long term (current) drug therapy: Secondary | ICD-10-CM | POA: Insufficient documentation

## 2021-02-16 DIAGNOSIS — E1169 Type 2 diabetes mellitus with other specified complication: Secondary | ICD-10-CM | POA: Diagnosis not present

## 2021-02-16 DIAGNOSIS — Z7982 Long term (current) use of aspirin: Secondary | ICD-10-CM | POA: Insufficient documentation

## 2021-02-16 DIAGNOSIS — Z7984 Long term (current) use of oral hypoglycemic drugs: Secondary | ICD-10-CM | POA: Insufficient documentation

## 2021-02-16 DIAGNOSIS — W260XXA Contact with knife, initial encounter: Secondary | ICD-10-CM | POA: Diagnosis not present

## 2021-02-16 DIAGNOSIS — S71111A Laceration without foreign body, right thigh, initial encounter: Secondary | ICD-10-CM | POA: Insufficient documentation

## 2021-02-16 DIAGNOSIS — S79921A Unspecified injury of right thigh, initial encounter: Secondary | ICD-10-CM | POA: Diagnosis present

## 2021-02-16 MED ORDER — LIDOCAINE-EPINEPHRINE (PF) 2 %-1:200000 IJ SOLN
20.0000 mL | Freq: Once | INTRAMUSCULAR | Status: AC
  Administered 2021-02-16: 20 mL
  Filled 2021-02-16: qty 20

## 2021-02-16 NOTE — ED Notes (Signed)
Dressing applied to sutures.

## 2021-02-16 NOTE — ED Triage Notes (Signed)
Pt was carving a hammer handle today at home and the knife slipped and cut his right upper thigh.

## 2021-02-16 NOTE — ED Notes (Signed)
Dr Melina Copa in room w/pt now.

## 2021-02-16 NOTE — ED Notes (Signed)
Laceration to right lateral thigh measures 3.5 cm.

## 2021-02-16 NOTE — Discharge Instructions (Addendum)
You were seen in the emergency department for a laceration to your right thigh.  You will need your sutures removed in 10 to 12 days.  Return sooner if any signs of infection.

## 2021-02-16 NOTE — ED Provider Notes (Signed)
[facility] EMERGENCY DEPT Provider Note   CSN: [id number] Arrival date & time: 02/16/21  1704     History Chief Complaint  Patient presents with   Laceration    Bryan Wang is a 70 y.o. male.  He was seen in the emergency department for evaluation of laceration to your right thigh.  He accidentally cut himself with a knife.  No other complaints.  No numbness or weakness.  Tetanus is up-to-date.  Occurred few hours ago.  The history is provided by the patient.  Laceration Location:  Leg Leg laceration location:  R leg and R upper leg Length:  4 Depth:  Through underlying tissue Quality: jagged   Bleeding: controlled   Time since incident:  3 hours Laceration mechanism:  Knife Pain details:    Quality:  Dull   Severity:  Moderate   Timing:  Constant   Progression:  Unchanged Foreign body present:  No foreign bodies Relieved by:  None tried Worsened by:  Nothing Ineffective treatments:  None tried Tetanus status:  Up to date Associated symptoms: no fever, no focal weakness and no numbness       Past Medical History:  Diagnosis Date   ADD (attention deficit disorder)    Arthritis    Autoimmune hepatitis ([place])    Cancer ([place])    face/skin   Diabetes ([place])    Diabetes mellitus without complication ([place])    Fatty liver    High cholesterol    Hypertension    Joint pain    Left knee pain    Liver disease    Liver problem    Overweight    Sleep apnea    CPAP   SOB (shortness of breath)    Swelling of both lower extremities     Patient Active Problem List   Diagnosis Date Noted   Other insomnia 01/22/2021   Status post total left knee replacement 10/19/2020   At risk for hypoglycemia 08/22/2020   At risk for constipation 08/01/2020   Unilateral primary osteoarthritis, left knee 07/26/2020   At risk for activity intolerance 07/18/2020   Other hyperlipidemia 06/20/2020   Hypertension associated with type 2 diabetes mellitus ([place])  06/20/2020   Absolute anemia 06/20/2020   Diabetes mellitus ([place]) 06/04/2020   Hyperlipidemia associated with type 2 diabetes mellitus ([place]) 06/04/2020   Iron deficiency 06/04/2020    Past Surgical History:  Procedure Laterality Date   FACIAL FRACTURE SURGERY  1973   KNEE ARTHROSCOPY  2005   LIVER BIOPSY  1996   NECK SURGERY     TOTAL KNEE ARTHROPLASTY Left 10/19/2020   Procedure: LEFT TOTAL KNEE ARTHROPLASTY;  Surgeon: Mcarthur Rossetti, MD;  Location: [facility];  Service: Orthopedics;  Laterality: Left;       Family History  Problem Relation Age of Onset   Diabetes Father     Social History   Tobacco Use   Smoking status: Former    Pack years: 0.00    Types: Cigarettes    Quit date: 2004    Years since quitting: 18.5   Smokeless tobacco: Never  Vaping Use   Vaping Use: Never used  Substance Use Topics   Alcohol use: Yes    Comment: occasionally   Drug use: No    Home Medications Prior to Admission medications   Medication Sig Start Date End Date Taking? Authorizing Provider  Alogliptin Benzoate 25 MG TABS Take 25 mg by mouth daily.   Yes [provider]  aspirin  81 MG chewable tablet Chew 1 tablet (81 mg total) by mouth 2 (two) times daily. 10/20/20  Yes Mcarthur Rossetti, MD  atorvastatin (LIPITOR) 40 MG tablet Take 1 tablet (40 mg total) by mouth at bedtime. 01/22/21  Yes Opalski, Neoma Laming, DO  azaTHIOprine (IMURAN) 50 MG tablet Take 200 mg by mouth daily.    Yes [provider]  budesonide (ENTOCORT EC) 3 MG 24 hr capsule Take 9 mg by mouth daily.    Yes [provider]  ferrous sulfate 325 (65 FE) MG tablet Take 325 mg by mouth daily with breakfast.   Yes [provider]  glipiZIDE (GLUCOTROL) 5 MG tablet Take 0.5 tablets (2.5 mg total) by mouth 2 (two) times daily before a meal. Patient taking differently: Take 2.5 mg by mouth daily before breakfast. 09/25/20  Yes Opalski, Neoma Laming, DO  hydrochlorothiazide (HYDRODIURIL)  12.5 MG tablet Take 1 tablet (12.5 mg total) by mouth daily. 02/14/21  Yes Opalski, Neoma Laming, DO  latanoprost (XALATAN) 0.005 % ophthalmic solution Place 1 drop into both eyes at bedtime.   Yes [provider]  lisinopril (PRINIVIL,ZESTRIL) 2.5 MG tablet Take 2.5 mg by mouth daily.   Yes [provider]  metFORMIN (GLUCOPHAGE) 1000 MG tablet Take 1,000 mg by mouth 2 (two) times daily with a meal.   Yes [provider]  methocarbamol (ROBAXIN) 500 MG tablet Take 1 tablet (500 mg total) by mouth every 6 (six) hours as needed for muscle spasms. 10/20/20  Yes Mcarthur Rossetti, MD  methylphenidate 27 MG PO CR tablet Take 27 mg by mouth daily.    Yes [provider]  rOPINIRole (REQUIP) 2 MG tablet Take 2 mg by mouth at bedtime.   Yes [provider]  tamsulosin (FLOMAX) 0.4 MG CAPS capsule Take 0.4 mg by mouth daily.   Yes [provider]    Allergies    Pioglitazone  Review of Systems   Review of Systems  Constitutional:  Negative for fever.  Skin:  Positive for wound.  Neurological:  Negative for focal weakness.   Physical Exam Updated Vital Signs BP (!) 167/128 (BP Location: Right Arm)   Pulse (!) 116   Temp 97.7 F (36.5 C)   Resp 16   Ht 5\' 8"  (1.727 m)   Wt 117.9 kg   SpO2 97%   BMI 39.53 kg/m   Physical Exam Vitals and nursing note reviewed.  Constitutional:      Appearance: Normal appearance. He is well-developed.  HENT:     Head: Normocephalic and atraumatic.  Eyes:     Conjunctiva/sclera: Conjunctivae normal.  Pulmonary:     Effort: Pulmonary effort is normal.  Musculoskeletal:        General: Tenderness and signs of injury present. Normal range of motion.     Cervical back: Neck supple.     Comments: Is a four 7 m laceration right lateral thigh with some fat exposed.  There is extensor mechanism intact.  No foreign body.  No active bleeding.  Skin:    General: Skin is warm and dry.  Neurological:      General: No focal deficit present.     Mental Status: He is alert.     GCS: GCS eye subscore is 4. GCS verbal subscore is 5. GCS motor subscore is 6.    ED Results / Procedures / Treatments   Labs (all labs ordered are listed, but only abnormal results are displayed) Labs Reviewed - No data to display  EKG None  Radiology No results found.  Procedures .Marland KitchenLaceration Repair  Date/Time: 02/16/2021 7:26 PM Performed by: Hayden Rasmussen, MD Authorized by: Hayden Rasmussen, MD   Consent:    Consent obtained:  Verbal   Consent given by:  Patient   Risks discussed:  Infection, pain, poor cosmetic result, poor wound healing and retained foreign body   Alternatives discussed:  No treatment and delayed treatment Universal protocol:    Procedure explained and questions answered to patient or proxy's satisfaction: yes     Patient identity confirmed:  Verbally with patient Laceration details:    Location:  Leg   Leg location:  R upper leg   Length (cm):  5 Pre-procedure details:    Preparation:  Patient was prepped and draped in usual sterile fashion Treatment:    Area cleansed with:  Saline   Amount of cleaning:  Standard   Irrigation solution:  Sterile saline   Debridement:  None Skin repair:    Repair method:  Sutures   Suture size:  3-0   Suture material:  Nylon   Suture technique:  Simple interrupted   Number of sutures:  5 Repair type:    Repair type:  Simple Post-procedure details:    Dressing:  Non-adherent dressing   Procedure completion:  Tolerated well, no immediate complications   Medications Ordered in ED Medications  lidocaine-EPINEPHrine (XYLOCAINE W/EPI) 2 %-1:200000 (PF) injection 20 mL (has no administration in time range)    ED Course  I have reviewed the triage vital signs and the nursing notes.  Pertinent labs & imaging results that were available during my care of the patient were reviewed by me and considered in my medical decision making (see  chart for details).    MDM Rules/Calculators/A&P                           Final Clinical Impression(s) / ED Diagnoses Final diagnoses:  Laceration of right thigh, initial encounter    Rx / DC Orders ED Discharge Orders     None        Hayden Rasmussen, MD 02/17/21 [id number]

## 2021-02-16 NOTE — ED Notes (Signed)
Pt verbalizes understanding of discharge instructions. Opportunity for questioning and answers were provided. Armand removed by staff, pt discharged from ED to home. Educated to f/u for suture removal.

## 2021-02-25 NOTE — Progress Notes (Signed)
Chief Complaint:   OBESITY Bryan Wang is here to discuss his progress with his obesity treatment plan along with follow-up of his obesity related diagnoses.   Today's visit was #: 18 Starting weight: 299 lbs Starting date: 04/03/2020 Today's weight: 266 lbs Today's date: 02/13/2021 Weight change since last visit: +4 lbs Total lbs lost to date: 33 lbs Body mass index is 40.45 kg/m.  Total weight loss percentage to date: -11.04%  Interim History:  Bryan Wang says he has been traveling a lot and has been unable to follow the plan over the last couple of weeks.  No issues with the meal plan.  He endorses dizziness.   Plan:  Order labs today.  Current Meal Plan: the Category 2 Plan for 30% of the time.  Current Exercise Plan: Working outside.  Assessment/Plan:   Orders Placed This Encounter  Procedures   VITAMIN D 25 Hydroxy (Vit-D Deficiency, Fractures)   Comprehensive metabolic panel   CBC with Differential/Platelet   Hemoglobin A1c   TSH   Medications Discontinued During This Encounter  Medication Reason   hydrochlorothiazide (HYDRODIURIL) 25 MG tablet    hydrochlorothiazide (HYDRODIURIL) 12.5 MG tablet Reorder   Meds ordered this encounter  Medications   DISCONTD: hydrochlorothiazide (HYDRODIURIL) 12.5 MG tablet    Sig: Take 1 tablet (12.5 mg total) by mouth daily.    Pt will cut his existing 25mg  tabs in half   hydrochlorothiazide (HYDRODIURIL) 12.5 MG tablet    Sig: Take 1 tablet (12.5 mg total) by mouth daily.    Dispense:  30 tablet    Refill:  0    Pt will cut his existing 25mg  tabs in half    1. Type 2 diabetes mellitus with other specified complication, without long-term current use of insulin (HCC) Diabetes Mellitus: Not at goal. Medication: glipizide 2.5 mg twice daily, metformin 1,000 mg twice daily. Issues reviewed: blood sugar goals, complications of diabetes mellitus, hypoglycemia prevention and treatment, exercise, and nutrition.  FBS 100-135 highest.   Very stable.  Plan:  Check A1c, CMP today.  Stable blood sugars at home.  A1c well controlled 4 months ago.  Continue medications.  Consider change to GLP-1 in the future.  Gets his medication through the [place]. The importance of regular follow up with PCP and all other specialists as scheduled was stressed to patient today. The patient will continue to focus on protein-rich, low simple carbohydrate foods. We reviewed the importance of hydration, regular exercise for stress reduction, and restorative sleep.   Lab Results  Component Value Date   HGBA1C 6.0 (H) 02/13/2021   HGBA1C 6.0 (H) 10/12/2020   HGBA1C 5.9 (H) 08/01/2020   Lab Results  Component Value Date   LDLCALC 51 08/01/2020   CREATININE 0.85 02/13/2021   - Hemoglobin A1c  2. Hypertension associated with type 2 diabetes mellitus ([place]) Stable today.  Medications: HCTZ 12.5 mg daily, lisinopril 2.5 mg daily.  If he bends over and stands up quickly, he feels dizzy.  This has occurred 3 times in the past 2 weeks.  Not checking blood pressure regularly at home.  Plan: Avoid buying foods that are: processed, frozen, or prepackaged to avoid excess salt. Ambulatory blood pressure monitoring was encouraged with a goal of at least 2-3 times weekly or when feeling poorly.  He was instructed to keep a log for [name] to review at each office visit.   We will continue to monitor closely alongside his PCP and/or Specialist.  Regular follow up with  PCP and specialists was also encouraged.  Will check CMP, CBC, and TSH today.  Blood pressure is stable today.  BP Readings from Last 3 Encounters:  02/16/21 (!) 114/98  02/13/21 117/65  01/22/21 107/78   Lab Results  Component Value Date   CREATININE 0.85 02/13/2021   - Comprehensive metabolic panel - hydrochlorothiazide (HYDRODIURIL) 12.5 MG tablet; Take 1 tablet (12.5 mg total) by mouth daily.  Dispense: 30 tablet; Refill: 0  3. Orthostatic dizziness He has had 3 occurrances with bending over.   Called PCP and has not heard back yet.  No other heart palpitations, chest pain, shortness of breath, etc.  Plan:  Check CMP, CBC, TSH.  Keep blood pressure log and do follow-up with PCP as he has planned to do in the near future.  - Comprehensive metabolic panel - CBC with Differential/Platelet - Hemoglobin A1c - TSH  4. Fatigue, unspecified type Worse than usual.  Chronic.  Plan:  Check TSH today.  - TSH  5. Vitamin D deficiency Not at goal.  He is taking an unknown dose of OTC vitamin D.  Plan: Continue current OTC vitamin D supplementation.  Will check vitamin D level today.  Lab Results  Component Value Date   VD25OH 23.3 (L) 02/13/2021   VD25OH 20.0 (L) 08/01/2020   - VITAMIN D 25 Hydroxy (Vit-D Deficiency, Fractures)  6. Class 3 severe obesity with serious comorbidity and body mass index (BMI) of 45.0 to 49.9 in adult, unspecified obesity type ([place])  Course: Bryan Wang is currently in the action stage of change. As such, his goal is to continue with weight loss efforts.   Nutrition goals: He has agreed to the Category 2 Plan.   Exercise goals:  As is.  Behavioral modification strategies: no skipping meals and meal planning and cooking strategies.  Bryan Wang has agreed to follow-up with our clinic in 2 weeks- we will go over labs drawn today. He was informed of the importance of frequent follow-up visits to maximize his success with intensive lifestyle modifications for his multiple health conditions.   Bryan Wang was informed we would discuss his lab results at his next visit unless there is a critical issue that needs to be addressed sooner. Bryan Wang agreed to keep his next visit at the agreed upon time to discuss these results.  Objective:   Blood pressure 117/65, pulse 75, temperature 97.9 F (36.6 C), height 5\' 8"  (1.727 m), weight 266 lb (120.7 kg), SpO2 98 %. Body mass index is 40.45 kg/m.  General: Cooperative, alert, well developed, in no acute distress. HEENT:  Conjunctivae and lids unremarkable. Cardiovascular: Regular rhythm.  Lungs: Normal work of breathing. Neurologic: No focal deficits.   Lab Results  Component Value Date   CREATININE 0.85 02/13/2021   BUN 17 02/13/2021   NA 139 02/13/2021   K 4.3 02/13/2021   CL 96 02/13/2021   CO2 24 02/13/2021   Lab Results  Component Value Date   ALT 25 02/13/2021   AST 19 02/13/2021   ALKPHOS 65 02/13/2021   BILITOT 0.5 02/13/2021   Lab Results  Component Value Date   HGBA1C 6.0 (H) 02/13/2021   HGBA1C 6.0 (H) 10/12/2020   HGBA1C 5.9 (H) 08/01/2020   HGBA1C 7.4 05/03/2020   HGBA1C 7.4 05/03/2020   Lab Results  Component Value Date   INSULIN 50.7 (H) 04/03/2020   Lab Results  Component Value Date   TSH 3.530 02/13/2021   Lab Results  Component Value Date   CHOL 110 08/01/2020  HDL 41 08/01/2020   LDLCALC 51 08/01/2020   TRIG 92 08/01/2020   CHOLHDL 2.7 08/01/2020   Lab Results  Component Value Date   VD25OH 23.3 (L) 02/13/2021   VD25OH 20.0 (L) 08/01/2020   Lab Results  Component Value Date   WBC 10.9 (H) 02/13/2021   HGB 14.5 02/13/2021   HCT 44.4 02/13/2021   MCV 92 02/13/2021   PLT 303 02/13/2021   Lab Results  Component Value Date   IRON 51 08/01/2020   TIBC 253 08/01/2020   FERRITIN 147 08/01/2020   Obesity Behavioral Intervention:   Approximately 15 minutes were spent on the discussion below.  ASK: We discussed the diagnosis of obesity with Bryan Wang today and Bryan Wang agreed to give [name] permission to discuss obesity behavioral modification therapy today.  ASSESS: Mani has the diagnosis of obesity and his BMI today is 40.5. Joren is in the action stage of change.   ADVISE: Eesa was educated on the multiple health risks of obesity as well as the benefit of weight loss to improve his health. He was advised of the need for long term treatment and the importance of lifestyle modifications to improve his current health and to decrease his risk of future  health problems.  AGREE: Multiple dietary modification options and treatment options were discussed and Jamauri agreed to follow the recommendations documented in the above note.  ARRANGE: Curties was educated on the importance of frequent visits to treat obesity as outlined per CMS and USPSTF guidelines and agreed to schedule his next follow up appointment today.  Attestation Statements:   Reviewed by clinician on day of visit: allergies, medications, problem list, medical history, surgical history, family history, social history, and previous encounter notes.  I, Water quality scientist, CMA, am acting as Location manager for [employer], DO.  I have reviewed the above documentation for accuracy and completeness, and I agree with the above. Marjory Sneddon, D.O.  The [place] was signed into law in 2016 which includes the topic of electronic health records.  This provides immediate access to information in MyChart.  This includes consultation notes, operative notes, office notes, lab results and pathology reports.  If you have any questions about what you read please let us know at your next visit so we can discuss your concerns and take corrective action if need be.  We are right here with you.

## 2021-02-27 ENCOUNTER — Other Ambulatory Visit: Payer: Self-pay

## 2021-02-27 ENCOUNTER — Ambulatory Visit (INDEPENDENT_AMBULATORY_CARE_PROVIDER_SITE_OTHER): Payer: Medicare Other | Admitting: Family Medicine

## 2021-02-27 ENCOUNTER — Encounter (INDEPENDENT_AMBULATORY_CARE_PROVIDER_SITE_OTHER): Payer: Self-pay | Admitting: Family Medicine

## 2021-02-27 VITALS — BP 111/74 | HR 105 | Temp 97.8°F | Ht 68.0 in | Wt 266.0 lb

## 2021-02-27 DIAGNOSIS — I152 Hypertension secondary to endocrine disorders: Secondary | ICD-10-CM

## 2021-02-27 DIAGNOSIS — E1169 Type 2 diabetes mellitus with other specified complication: Secondary | ICD-10-CM

## 2021-02-27 DIAGNOSIS — E559 Vitamin D deficiency, unspecified: Secondary | ICD-10-CM

## 2021-02-27 DIAGNOSIS — E1159 Type 2 diabetes mellitus with other circulatory complications: Secondary | ICD-10-CM | POA: Diagnosis not present

## 2021-02-27 DIAGNOSIS — Z6841 Body Mass Index (BMI) 40.0 and over, adult: Secondary | ICD-10-CM | POA: Diagnosis not present

## 2021-02-27 MED ORDER — VITAMIN D (ERGOCALCIFEROL) 1.25 MG (50000 UNIT) PO CAPS: 50000.0000 [IU] | ORAL_CAPSULE | ORAL | 0 refills | Status: DC

## 2021-03-12 NOTE — Progress Notes (Signed)
Chief Complaint:   OBESITY Bryan Wang is here to discuss his progress with his obesity treatment plan along with follow-up of his obesity related diagnoses.   Today's visit was #: 83 Starting weight: 299 lbs Starting date: 04/03/2020 Today's weight: 266 lbs Today's date: 02/27/2021 Weight change since last visit: 0 Total lbs lost to date: 33 lbs Body mass index is 40.45 kg/m.  Total weight loss percentage to date: -11.04%  Interim History:  Bryan Wang has not been as dedicated recently, but cleaned out the junk food from his kitchen and will stock up with food from the plan in the near future.  He is here to review labs drawn last office visit including TSH, CBC, CMP, vitamin D, etc.  Current Meal Plan: the Category 2 Plan for 50% of the time.  Current Exercise Plan: None.  Assessment/Plan:   Meds ordered this encounter  Medications   Vitamin D, Ergocalciferol, (DRISDOL) 1.25 MG (50000 UNIT) CAPS capsule    Sig: Take 1 capsule (50,000 Units total) by mouth every 7 (seven) days.    Dispense:  4 capsule    Refill:  0    1. Type 2 diabetes mellitus with other specified complication, without long-term current use of insulin (HCC) Diabetes Mellitus: Not at goal. Medication: metformin 1,000 mg twice daily, glipizide 2.5 mg twice daily. Issues reviewed: blood sugar goals, complications of diabetes mellitus, hypoglycemia prevention and treatment, exercise, and nutrition.  Highest 123 and lows 100.  No lows beyond.  Plan:  Discussed labs with patient today.  A1c at goal.  The importance of regular follow up with PCP and all other specialists as scheduled was stressed to patient today. The patient will continue to focus on protein-rich, low simple carbohydrate foods. We reviewed the importance of hydration, regular exercise for stress reduction, and restorative sleep.   Lab Results  Component Value Date   HGBA1C 6.0 (H) 02/13/2021   HGBA1C 6.0 (H) 10/12/2020   HGBA1C 5.9 (H) 08/01/2020    Lab Results  Component Value Date   LDLCALC 51 08/01/2020   CREATININE 0.85 02/13/2021   2. Hypertension associated with type 2 diabetes mellitus (Bryan Wang) Not at goal. Medications: HCTZ 12.5 mg daily, lisinopril 2.5 mg daily.   Plan:  Discussed labs with patient today.  CMP and CBC within normal limits.  Blood pressure at goal.  No issues.  Avoid buying foods that are: processed, frozen, or prepackaged to avoid excess salt. We will watch for signs of hypotension as he continues lifestyle modifications. We will continue to monitor closely alongside his PCP and/or Specialist.  Regular follow up with PCP and specialists was also encouraged.   BP Readings from Last 3 Encounters:  02/27/21 111/74  02/16/21 (!) 114/98  02/13/21 117/65   Lab Results  Component Value Date   CREATININE 0.85 02/13/2021   3. Vitamin D deficiency Not at goal.  He has been taking OTC vitamin D - not sure of amount.  Still not at goal.  Plan:  New.  Discussed labs with patient today.  Start to take prescription Vitamin D '@50'$ ,000 IU every week as prescribed.  Follow-up for routine testing of Vitamin D, at least 2-3 times per year to avoid over-replacement.  Lab Results  Component Value Date   VD25OH 23.3 (L) 02/13/2021   VD25OH 20.0 (L) 08/01/2020   - Start Vitamin D, Ergocalciferol, (DRISDOL) 1.25 MG (50000 UNIT) CAPS capsule; Take 1 capsule (50,000 Units total) by mouth every 7 (seven) days.  Dispense:  4 capsule; Refill: 0  4. Class 3 severe obesity with serious comorbidity and body mass index (BMI) of 45.0 to 49.9 in adult, unspecified obesity type (Bryan Wang)  Course: Bryan Wang is currently in the action stage of change. As such, his goal is to continue with weight loss efforts.   Nutrition goals: He has agreed to the Category 2 Plan.   Exercise goals: All adults should avoid inactivity. Some physical activity is better than none, and adults who participate in any amount of physical activity gain some health  benefits.  Behavioral modification strategies: meal planning and cooking strategies, keeping healthy foods in the home, and planning for success.  Bryan Wang has agreed to follow-up with our clinic in 2-3 weeks. He was informed of the importance of frequent follow-up visits to maximize his success with intensive lifestyle modifications for his multiple health conditions.   Objective:   Blood pressure 111/74, pulse (!) 105, temperature 97.8 F (36.6 C), height '5\' 8"'$  (1.727 m), weight 266 lb (120.7 kg), SpO2 98 %. Body mass index is 40.45 kg/m.  General: Cooperative, alert, well developed, in no acute distress. HEENT: Conjunctivae and lids unremarkable. Cardiovascular: Regular rhythm.  Lungs: Normal work of breathing. Neurologic: No focal deficits.   Lab Results  Component Value Date   CREATININE 0.85 02/13/2021   BUN 17 02/13/2021   NA 139 02/13/2021   K 4.3 02/13/2021   CL 96 02/13/2021   CO2 24 02/13/2021   Lab Results  Component Value Date   ALT 25 02/13/2021   AST 19 02/13/2021   ALKPHOS 65 02/13/2021   BILITOT 0.5 02/13/2021   Lab Results  Component Value Date   HGBA1C 6.0 (H) 02/13/2021   HGBA1C 6.0 (H) 10/12/2020   HGBA1C 5.9 (H) 08/01/2020   HGBA1C 7.4 05/03/2020   HGBA1C 7.4 05/03/2020   Lab Results  Component Value Date   INSULIN 50.7 (H) 04/03/2020   Lab Results  Component Value Date   TSH 3.530 02/13/2021   Lab Results  Component Value Date   CHOL 110 08/01/2020   HDL 41 08/01/2020   LDLCALC 51 08/01/2020   TRIG 92 08/01/2020   CHOLHDL 2.7 08/01/2020   Lab Results  Component Value Date   VD25OH 23.3 (L) 02/13/2021   VD25OH 20.0 (L) 08/01/2020   Lab Results  Component Value Date   WBC 10.9 (H) 02/13/2021   HGB 14.5 02/13/2021   HCT 44.4 02/13/2021   MCV 92 02/13/2021   PLT 303 02/13/2021   Lab Results  Component Value Date   IRON 51 08/01/2020   TIBC 253 08/01/2020   FERRITIN 147 08/01/2020   Obesity Behavioral Intervention:    Approximately 15 minutes were spent on the discussion below.  ASK: We discussed the diagnosis of obesity with Bryan Wang today and Bryan Wang agreed to give [name] permission to discuss obesity behavioral modification therapy today.  ASSESS: Bryan Wang has the diagnosis of obesity and his BMI today is 40.5. Bryan Wang is in the action stage of change.   ADVISE: Bryan Wang was educated on the multiple health risks of obesity as well as the benefit of weight loss to improve his health. He was advised of the need for long term treatment and the importance of lifestyle modifications to improve his current health and to decrease his risk of future health problems.  AGREE: Multiple dietary modification options and treatment options were discussed and Bryan Wang agreed to follow the recommendations documented in the above note.  ARRANGE: Bryan Wang was educated on the importance of  frequent visits to treat obesity as outlined per CMS and USPSTF guidelines and agreed to schedule his next follow up appointment today.  Attestation Statements:   Reviewed by clinician on day of visit: allergies, medications, problem list, medical history, surgical history, family history, social history, and previous encounter notes.  I, Water quality scientist, CMA, am acting as Location manager for [employer], DO.  I have reviewed the above documentation for accuracy and completeness, and I agree with the above. Marjory Sneddon, D.O.  The [place] was signed into law in 2016 which includes the topic of electronic health records.  This provides immediate access to information in MyChart.  This includes consultation notes, operative notes, office notes, lab results and pathology reports.  If you have any questions about what you read please let us know at your next visit so we can discuss your concerns and take corrective action if need be.  We are right here with you.

## 2021-03-13 ENCOUNTER — Ambulatory Visit (INDEPENDENT_AMBULATORY_CARE_PROVIDER_SITE_OTHER): Payer: Medicare Other | Admitting: Family Medicine

## 2021-04-03 ENCOUNTER — Ambulatory Visit (INDEPENDENT_AMBULATORY_CARE_PROVIDER_SITE_OTHER): Payer: No Typology Code available for payment source | Admitting: Family Medicine

## 2021-04-17 ENCOUNTER — Ambulatory Visit (INDEPENDENT_AMBULATORY_CARE_PROVIDER_SITE_OTHER): Payer: No Typology Code available for payment source | Admitting: Family Medicine

## 2021-05-07 ENCOUNTER — Ambulatory Visit (INDEPENDENT_AMBULATORY_CARE_PROVIDER_SITE_OTHER): Payer: Medicare Other | Admitting: Family Medicine

## 2021-05-14 ENCOUNTER — Other Ambulatory Visit: Payer: Self-pay

## 2021-05-14 ENCOUNTER — Ambulatory Visit (INDEPENDENT_AMBULATORY_CARE_PROVIDER_SITE_OTHER): Payer: Medicare Other | Admitting: Family Medicine

## 2021-05-14 ENCOUNTER — Encounter (INDEPENDENT_AMBULATORY_CARE_PROVIDER_SITE_OTHER): Payer: Self-pay | Admitting: Family Medicine

## 2021-05-14 VITALS — BP 124/54 | HR 64 | Temp 97.7°F | Ht 68.0 in | Wt 279.0 lb

## 2021-05-14 DIAGNOSIS — E559 Vitamin D deficiency, unspecified: Secondary | ICD-10-CM | POA: Diagnosis not present

## 2021-05-14 DIAGNOSIS — Z6841 Body Mass Index (BMI) 40.0 and over, adult: Secondary | ICD-10-CM | POA: Diagnosis not present

## 2021-05-14 MED ORDER — VITAMIN D (ERGOCALCIFEROL) 1.25 MG (50000 UNIT) PO CAPS: ORAL_CAPSULE | ORAL | 0 refills | Status: DC
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
## 2021-06-04 ENCOUNTER — Ambulatory Visit: Payer: Self-pay

## 2021-06-04 ENCOUNTER — Encounter: Payer: Self-pay | Admitting: Orthopaedic Surgery

## 2021-06-04 ENCOUNTER — Ambulatory Visit (INDEPENDENT_AMBULATORY_CARE_PROVIDER_SITE_OTHER): Payer: Medicare Other | Admitting: Orthopaedic Surgery

## 2021-06-04 ENCOUNTER — Ambulatory Visit (INDEPENDENT_AMBULATORY_CARE_PROVIDER_SITE_OTHER): Payer: Medicare Other | Admitting: Physician Assistant

## 2021-06-04 DIAGNOSIS — Z96652 Presence of left artificial knee joint: Secondary | ICD-10-CM

## 2021-06-04 DIAGNOSIS — M25562 Pain in left knee: Secondary | ICD-10-CM | POA: Diagnosis not present

## 2021-06-04 NOTE — Progress Notes (Signed)
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  This was performed with press-fit implants.  He is 70 years old.  He reports that he is 95% better.  His biggest complaint is getting shoes off and on as well as hiking boots off and on on the left leg.  He is interested in more outpatient physical therapy to help with those ADLs and just increasing his functionality and flexibility overall.  He denies any swelling.  Examination of his left operative knee shows the incisions healed nicely.  He has full extension to almost full flexion.  The knee feels ligamentously stable and there is no effusion.  2 views left knee show well-seated press-fit total knee arthroplasty with no complicating features.  I did give him prescription for outpatient physical therapy.  We will see him in 5 months at his 1 year follow-up.  We will have a final AP and lateral of his left knee at that visit.  All questions and concerns were answered and addressed.

## 2021-06-05 ENCOUNTER — Ambulatory Visit (INDEPENDENT_AMBULATORY_CARE_PROVIDER_SITE_OTHER): Payer: Medicare Other | Admitting: Physician Assistant

## 2021-06-11 ENCOUNTER — Ambulatory Visit (INDEPENDENT_AMBULATORY_CARE_PROVIDER_SITE_OTHER): Payer: Medicare Other | Admitting: Physician Assistant

## 2021-06-13 ENCOUNTER — Telehealth: Payer: Self-pay | Admitting: Orthopaedic Surgery

## 2021-06-13 DIAGNOSIS — Z96652 Presence of left artificial knee joint: Secondary | ICD-10-CM

## 2021-06-13 NOTE — Telephone Encounter (Signed)
Renee with [facility] called stating the pt wants to start getting PT again but his VA insurance expired in June. Renee states when she called the [employer] she was told he would need to be referred by his physician. She would like to have a referral faxed over and she's not sure if we have to contact the [place] but she would like [name] to do that as well if needed.  Renee CB# [phone] Renees Fax# [phone]  VA# [phone]

## 2021-06-13 NOTE — Telephone Encounter (Signed)
Order placed in chart. Can you help me with this?

## 2021-06-17 NOTE — Telephone Encounter (Signed)
Referral faxed as directed

## 2021-06-24 ENCOUNTER — Telehealth: Payer: Self-pay

## 2021-06-24 NOTE — Telephone Encounter (Signed)
Marcelene [place],     I received a call from [place] at [facility] regarding Elman Dettman  DOB 09/11/1950.  This veteran had [place] authorization for care with Dr Ninfa Linden at [facility] that expired on 01/22/21.  The veteran is apparently needing additional PT which would be ordered through Dr Ninfa Linden. As the authorization is expired, he would need a new auth for orthopedic and PT care. We have not received a request from [facility] regarding this need for continuation.  Can your office please send a Request for Services if Mr Capili needs more care?  Request form can be emailed to: [email or web address]     Thank you.

## 2021-06-24 NOTE — Telephone Encounter (Signed)
Faxed to [facility] [phone]

## 2021-06-24 NOTE — Telephone Encounter (Signed)
Received email from [place] ([place]). See message below.

## 2021-07-01 NOTE — Telephone Encounter (Signed)
The Rehab facility is stating that the [place] didn't receive the referral and they would like to know if we can send it again so they can start treating this patient.  Please advise

## 2021-07-02 ENCOUNTER — Ambulatory Visit (INDEPENDENT_AMBULATORY_CARE_PROVIDER_SITE_OTHER): Payer: Medicare Other | Admitting: Family Medicine

## 2021-07-02 NOTE — Telephone Encounter (Signed)
Re-faxed.

## 2021-08-20 DIAGNOSIS — R2689 Other abnormalities of gait and mobility: Secondary | ICD-10-CM | POA: Diagnosis not present

## 2021-08-20 DIAGNOSIS — Z96652 Presence of left artificial knee joint: Secondary | ICD-10-CM | POA: Diagnosis not present

## 2021-08-20 DIAGNOSIS — M6281 Muscle weakness (generalized): Secondary | ICD-10-CM | POA: Diagnosis not present

## 2021-08-20 DIAGNOSIS — M25562 Pain in left knee: Secondary | ICD-10-CM | POA: Diagnosis not present

## 2021-09-18 ENCOUNTER — Other Ambulatory Visit: Payer: Self-pay

## 2021-09-18 ENCOUNTER — Encounter (INDEPENDENT_AMBULATORY_CARE_PROVIDER_SITE_OTHER): Payer: Self-pay | Admitting: Family Medicine

## 2021-09-18 ENCOUNTER — Ambulatory Visit (INDEPENDENT_AMBULATORY_CARE_PROVIDER_SITE_OTHER): Payer: Medicare HMO | Admitting: Family Medicine

## 2021-09-18 VITALS — BP 117/71 | HR 92 | Temp 97.9°F | Ht 68.0 in | Wt 305.0 lb

## 2021-09-18 DIAGNOSIS — E559 Vitamin D deficiency, unspecified: Secondary | ICD-10-CM

## 2021-09-18 DIAGNOSIS — E669 Obesity, unspecified: Secondary | ICD-10-CM

## 2021-09-18 DIAGNOSIS — I152 Hypertension secondary to endocrine disorders: Secondary | ICD-10-CM | POA: Diagnosis not present

## 2021-09-18 DIAGNOSIS — E1159 Type 2 diabetes mellitus with other circulatory complications: Secondary | ICD-10-CM | POA: Diagnosis not present

## 2021-09-18 DIAGNOSIS — E1169 Type 2 diabetes mellitus with other specified complication: Secondary | ICD-10-CM | POA: Diagnosis not present

## 2021-09-18 DIAGNOSIS — Z6841 Body Mass Index (BMI) 40.0 and over, adult: Secondary | ICD-10-CM | POA: Diagnosis not present

## 2021-09-18 NOTE — Progress Notes (Signed)
Chief Complaint:   OBESITY Bryan Wang is here to discuss his progress with his obesity treatment plan along with follow-up of his obesity related diagnoses. Bryan Wang is on the Category 2 Plan and states he is following his eating plan approximately 0% of the time. Bryan Wang states he is walking and yard work 45-60 minutes 4 times per week.  Today's visit was #: 55 Starting weight: 299 lbs Starting date: 04/03/2020 Today's weight: 305 lbs Today's date: 09/18/2021 Total lbs lost to date: 0 Total lbs lost since last in-office visit: +26  Interim History: It has been 5 months since pt's last OV at the end of Sept. Pt's start weight was 299 lbs and is now 305 lbs. He has been off plan for 5 months. He was previously on category 2. Pt has been eating a lot lately.  Subjective:   1. Type 2 diabetes mellitus with other specified complication, without long-term current use of insulin (HCC) FBS in the 100's but are never over 120. Lowest read was 85. Pt is on Metformin and Glipizide, per PCP, Dr. Rondell Reams.  2. Hypertension associated with type 2 diabetes mellitus ([place]) At goal. Review: taking medications as instructed, no medication side effects noted, no chest pain on exertion, no dyspnea on exertion, no swelling of ankles.   3. Vitamin D deficiency Pt has been off Vit D for 4-5 months.  Assessment/Plan:   1. Type 2 diabetes mellitus with other specified complication, without long-term current use of insulin (HCC) A1c at goal, per pt. Bring labs to next OV. Once blood work is reviewed, will consider additional meds to aid in weight loss in the future prn.  2. Hypertension associated with type 2 diabetes mellitus ([place]) At goal. Bryan Wang is working on healthy weight loss and exercise to improve blood pressure control. We will watch for signs of hypotension as he continues his lifestyle modifications.  3. Vitamin D deficiency Review labs at next OV. May need additional labs. If Vit D is too low, will  need to restart Ergocalciferol.  4. Obesity, current BMI 46.38 Bryan Wang is currently in the action stage of change. As such, his goal is to continue with weight loss efforts. He has agreed to [facility] the Category 2 Plan.   Pt had labs with PCP. A1c 6.1- pt will bring copy of labs to next OV.  - Pt will likely need labs at next OV.  Exercise goals:  As is  Behavioral modification strategies: meal planning and cooking strategies, keeping healthy foods in the home, avoiding temptations, and planning for success.  [name] has agreed to follow-up with our clinic in 2 weeks. He was informed of the importance of frequent follow-up visits to maximize his success with intensive lifestyle modifications for his multiple health conditions.    Objective:   Blood pressure 117/71, pulse 92, temperature 97.9 F (36.6 C), height 5\' 8"  (1.727 m), weight (!) 305 lb (138.3 kg), SpO2 98 %. Body mass index is 46.38 kg/m.  General: Cooperative, alert, well developed, in no acute distress. HEENT: Conjunctivae and lids unremarkable. Cardiovascular: Regular rhythm.  Lungs: Normal work of breathing. Neurologic: No focal deficits.   Lab Results  Component Value Date   CREATININE 0.85 02/13/2021   BUN 17 02/13/2021   NA 139 02/13/2021   K 4.3 02/13/2021   CL 96 02/13/2021   CO2 24 02/13/2021   Lab Results  Component Value Date   ALT 25 02/13/2021   AST 19 02/13/2021   ALKPHOS 65  02/13/2021   BILITOT 0.5 02/13/2021   Lab Results  Component Value Date   HGBA1C 6.0 (H) 02/13/2021   HGBA1C 6.0 (H) 10/12/2020   HGBA1C 5.9 (H) 08/01/2020   HGBA1C 7.4 05/03/2020   HGBA1C 7.4 05/03/2020   Lab Results  Component Value Date   INSULIN 50.7 (H) 04/03/2020   Lab Results  Component Value Date   TSH 3.530 02/13/2021   Lab Results  Component Value Date   CHOL 110 08/01/2020   HDL 41 08/01/2020   LDLCALC 51 08/01/2020   TRIG 92 08/01/2020   CHOLHDL 2.7 08/01/2020   Lab Results  Component Value Date    VD25OH 23.3 (L) 02/13/2021   VD25OH 20.0 (L) 08/01/2020   Lab Results  Component Value Date   WBC 10.9 (H) 02/13/2021   HGB 14.5 02/13/2021   HCT 44.4 02/13/2021   MCV 92 02/13/2021   PLT 303 02/13/2021   Lab Results  Component Value Date   IRON 51 08/01/2020   TIBC 253 08/01/2020   FERRITIN 147 08/01/2020     Obesity Behavioral Intervention:   Approximately 15 minutes were spent on the discussion below.  ASK: We discussed the diagnosis of obesity with Bryan Wang today and Bryan Wang agreed to give [name] permission to discuss obesity behavioral modification therapy today.  ASSESS: Bryan Wang has the diagnosis of obesity and his BMI today is 46.3. [place] is in the action stage of change.   ADVISE: Bryan Wang was educated on the multiple health risks of obesity as well as the benefit of weight loss to improve his health. He was advised of the need for long term treatment and the importance of lifestyle modifications to improve his current health and to decrease his risk of future health problems.  AGREE: Multiple dietary modification options and treatment options were discussed and [place] agreed to follow the recommendations documented in the above note.  ARRANGE: Bryan Wang was educated on the importance of frequent visits to treat obesity as outlined per CMS and USPSTF guidelines and agreed to schedule his next follow up appointment today.  Attestation Statements:   Reviewed by clinician on day of visit: allergies, medications, problem list, medical history, surgical history, family history, social history, and previous encounter notes.  Coral Ceo, CMA, am acting as transcriptionist for [employer], DO.  I have reviewed the above documentation for accuracy and completeness, and I agree with the above. Marjory Sneddon, D.O.  The [place] was signed into law in 2016 which includes the topic of electronic health records.  This provides immediate access to  information in MyChart.  This includes consultation notes, operative notes, office notes, lab results and pathology reports.  If you have any questions about what you read please let us know at your next visit so we can discuss your concerns and take corrective action if need be.  We are right here with you.

## 2021-10-01 ENCOUNTER — Ambulatory Visit (INDEPENDENT_AMBULATORY_CARE_PROVIDER_SITE_OTHER): Payer: Medicare HMO | Admitting: Family Medicine

## 2021-10-01 ENCOUNTER — Other Ambulatory Visit: Payer: Self-pay

## 2021-10-01 ENCOUNTER — Encounter (INDEPENDENT_AMBULATORY_CARE_PROVIDER_SITE_OTHER): Payer: Self-pay | Admitting: Family Medicine

## 2021-10-01 VITALS — BP 113/65 | HR 67 | Temp 97.6°F | Ht 68.0 in | Wt 302.0 lb

## 2021-10-01 DIAGNOSIS — F5089 Other specified eating disorder: Secondary | ICD-10-CM

## 2021-10-01 DIAGNOSIS — Z6841 Body Mass Index (BMI) 40.0 and over, adult: Secondary | ICD-10-CM | POA: Diagnosis not present

## 2021-10-01 DIAGNOSIS — E1169 Type 2 diabetes mellitus with other specified complication: Secondary | ICD-10-CM | POA: Diagnosis not present

## 2021-10-01 DIAGNOSIS — E669 Obesity, unspecified: Secondary | ICD-10-CM

## 2021-10-01 DIAGNOSIS — R69 Illness, unspecified: Secondary | ICD-10-CM | POA: Diagnosis not present

## 2021-10-01 NOTE — Progress Notes (Signed)
Chief Complaint:   OBESITY Bryan Wang is here to discuss his progress with his obesity treatment plan along with follow-up of his obesity related diagnoses. Bryan Wang is on the Category 2 Plan and states he is following his eating plan approximately 30% of the time. Bryan Wang states he is yard work for 60 minutes 4 times per week.  Today's visit was #: 20 Starting weight: 299 lbs Starting date: 04/03/2020 Today's weight: 302 lbs Today's date: 10/01/2021 Total lbs lost to date: 0 Total lbs lost since last in-office visit: 3 lbs  Interim History: Bryan Wang returned 09-18-2021 after a 5 month hiatus from our program and was up 26 lbs. He has not adhered well to plan over the past few weeks. He has eaten out (a few times per week). He can adhere to plan well when he eats at home.   Subjective:   1. Type 2 diabetes mellitus with other specified complication, without long-term current use of insulin (HCC) Bryan Wang's last A1C was 6.2 per patient. He is on Metformin and glipizide. She denies hypoglycemia.  Lab Results  Component Value Date   HGBA1C 6.0 (H) 02/13/2021   HGBA1C 6.0 (H) 10/12/2020   HGBA1C 5.9 (H) 08/01/2020   Lab Results  Component Value Date   LDLCALC 51 08/01/2020   CREATININE 0.85 02/13/2021   Lab Results  Component Value Date   INSULIN 50.7 (H) 04/03/2020    2. Other disorder of eating/emotional eating Bryan Wang notes eating out of habit and boredom. He says he has a "grazing" habit.   Assessment/Plan:   1. Type 2 diabetes mellitus with other specified complication, without long-term current use of insulin (HCC) Bryan Wang will bring in lab results next office. He will continue taking Metformin and glipizide.  At some point in the future we should consider discontinuing glipizide and starting GLP-1 therapy.  2. Other disorder of eating/emotional eating Bryan Wang declined visit with Dr. Mallie Mussel our Bariatric Psychologist.  3. Obesity, current BMI 45.93 Bryan Wang is currently in the  action stage of change. As such, his goal is to continue with weight loss efforts. He has agreed to the Category 2 Plan.   Bryan Wang will work on making better choices when eating out.  Exercise goals:  As is.  Behavioral modification strategies: decreasing eating out and meal planning and cooking strategies.  Bryan Wang has agreed to follow-up with our clinic in 2-3 weeks with Dr. Raliegh Scarlet.  Objective:   Blood pressure 113/65, pulse 67, temperature 97.6 F (36.4 C), height 5\' 8"  (1.727 m), weight (!) 302 lb (137 kg), SpO2 97 %. Body mass index is 45.92 kg/m.  General: Cooperative, alert, well developed, in no acute distress. HEENT: Conjunctivae and lids unremarkable. Cardiovascular: Regular rhythm.  Lungs: Normal work of breathing. Neurologic: No focal deficits.   Lab Results  Component Value Date   CREATININE 0.85 02/13/2021   BUN 17 02/13/2021   NA 139 02/13/2021   K 4.3 02/13/2021   CL 96 02/13/2021   CO2 24 02/13/2021   Lab Results  Component Value Date   ALT 25 02/13/2021   AST 19 02/13/2021   ALKPHOS 65 02/13/2021   BILITOT 0.5 02/13/2021   Lab Results  Component Value Date   HGBA1C 6.0 (H) 02/13/2021   HGBA1C 6.0 (H) 10/12/2020   HGBA1C 5.9 (H) 08/01/2020   HGBA1C 7.4 05/03/2020   HGBA1C 7.4 05/03/2020   Lab Results  Component Value Date   INSULIN 50.7 (H) 04/03/2020   Lab Results  Component Value Date  TSH 3.530 02/13/2021   Lab Results  Component Value Date   CHOL 110 08/01/2020   HDL 41 08/01/2020   LDLCALC 51 08/01/2020   TRIG 92 08/01/2020   CHOLHDL 2.7 08/01/2020   Lab Results  Component Value Date   VD25OH 23.3 (L) 02/13/2021   VD25OH 20.0 (L) 08/01/2020   Lab Results  Component Value Date   WBC 10.9 (H) 02/13/2021   HGB 14.5 02/13/2021   HCT 44.4 02/13/2021   MCV 92 02/13/2021   PLT 303 02/13/2021   Lab Results  Component Value Date   IRON 51 08/01/2020   TIBC 253 08/01/2020   FERRITIN 147 08/01/2020   Attestation Statements:    Reviewed by clinician on day of visit: allergies, medications, problem list, medical history, surgical history, family history, social history, and previous encounter notes.  I, Lizbeth Bark, RMA, am acting as Location manager for [employer], [place].  I have reviewed the above documentation for accuracy and completeness, and I agree with the above. -  Georgianne Fick, FNP

## 2021-10-02 ENCOUNTER — Encounter (INDEPENDENT_AMBULATORY_CARE_PROVIDER_SITE_OTHER): Payer: Self-pay | Admitting: Family Medicine

## 2021-10-02 DIAGNOSIS — Z6841 Body Mass Index (BMI) 40.0 and over, adult: Secondary | ICD-10-CM | POA: Insufficient documentation

## 2021-10-02 DIAGNOSIS — F509 Eating disorder, unspecified: Secondary | ICD-10-CM | POA: Insufficient documentation

## 2021-10-17 ENCOUNTER — Ambulatory Visit (INDEPENDENT_AMBULATORY_CARE_PROVIDER_SITE_OTHER): Payer: Medicare HMO | Admitting: Family Medicine

## 2021-11-04 ENCOUNTER — Ambulatory Visit: Payer: No Typology Code available for payment source | Admitting: Orthopaedic Surgery

## 2021-11-07 ENCOUNTER — Ambulatory Visit (INDEPENDENT_AMBULATORY_CARE_PROVIDER_SITE_OTHER): Payer: Medicare HMO | Admitting: Family Medicine

## 2021-11-25 ENCOUNTER — Encounter: Payer: Self-pay | Admitting: Orthopaedic Surgery

## 2021-11-25 ENCOUNTER — Ambulatory Visit (INDEPENDENT_AMBULATORY_CARE_PROVIDER_SITE_OTHER): Payer: No Typology Code available for payment source | Admitting: Orthopaedic Surgery

## 2021-11-25 ENCOUNTER — Ambulatory Visit (INDEPENDENT_AMBULATORY_CARE_PROVIDER_SITE_OTHER): Payer: Medicare HMO

## 2021-11-25 DIAGNOSIS — Z96652 Presence of left artificial knee joint: Secondary | ICD-10-CM | POA: Diagnosis not present

## 2021-11-25 NOTE — Progress Notes (Signed)
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  He is 71 years old.  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  He says sometimes the knee is not trustworthy to him in terms of instability but that is now on the regular basis. ? ?His left knee is smooth and fluid range of motion.  It feels ligamentously stable on my exam.  His right knee is also normal on exam. ? ?An AP and lateral of the left knee standing shows a well-seated press-fit total knee arthroplasty with good alignment and no complicating features.  There is no evidence of loosening.  The AP view also shows his right knee which is normal appearing. ? ?At this point follow-up for his knee can be as needed.  He should still work on weight loss and quad strengthening exercises. ?

## 2021-11-28 ENCOUNTER — Encounter (INDEPENDENT_AMBULATORY_CARE_PROVIDER_SITE_OTHER): Payer: Self-pay | Admitting: Family Medicine

## 2021-11-28 ENCOUNTER — Ambulatory Visit (INDEPENDENT_AMBULATORY_CARE_PROVIDER_SITE_OTHER): Payer: Medicare HMO | Admitting: Family Medicine

## 2021-11-28 VITALS — BP 111/67 | HR 74 | Temp 97.8°F | Ht 68.0 in | Wt 305.0 lb

## 2021-11-28 DIAGNOSIS — I152 Hypertension secondary to endocrine disorders: Secondary | ICD-10-CM | POA: Diagnosis not present

## 2021-11-28 DIAGNOSIS — E1159 Type 2 diabetes mellitus with other circulatory complications: Secondary | ICD-10-CM

## 2021-11-28 DIAGNOSIS — E559 Vitamin D deficiency, unspecified: Secondary | ICD-10-CM

## 2021-11-28 DIAGNOSIS — E1169 Type 2 diabetes mellitus with other specified complication: Secondary | ICD-10-CM | POA: Diagnosis not present

## 2021-11-28 DIAGNOSIS — Z6841 Body Mass Index (BMI) 40.0 and over, adult: Secondary | ICD-10-CM

## 2021-12-03 LAB — TSH: TSH: 2.73 (ref <=5.90)

## 2021-12-03 LAB — HEMOGLOBIN A1C: Hemoglobin A1C: 6.7

## 2021-12-03 LAB — IRON,TIBC AND FERRITIN PANEL: Iron: 57

## 2021-12-11 NOTE — Progress Notes (Signed)
? ? ? ?Chief Complaint:  ? ?OBESITY ?Bryan Wang is here to discuss his progress with his obesity treatment plan along with follow-up of his obesity related diagnoses. Bryan Wang is on the Category 2 Plan and states he is following his eating plan approximately 20% of the time. Bryan Wang states he is active while at work.  ? ?Today's visit was #: 21 ?Starting weight: 299 lbs ?Starting date: 04/03/2020 ?Today's weight: 305 lbs ?Today's date: 11/28/2021 ?Total lbs lost to date: 0 ?Total lbs lost since last in-office visit: 0 ? ?Interim History: Bryan Wang last office visit with me was 09/18/2021. 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 He is able to snack less and eat on the plan more.  ? ?Subjective:  ? ?1. Type 2 diabetes mellitus with other specified complication, without long-term current use of insulin (Bryan Wang) ?Bryan Wang blood sugars range between 100-130's. No lows or highs.  ? ?2. Hypertension associated with type 2 diabetes mellitus (Bryan Wang) ?Bryan Wang is asymptomatic with no concerns. He is taking his medications as directed by his PCP. ? ?3. Vitamin D deficiency ?Bryan Wang is getting his Vitamin D from his PCP now. He is on Vitamin D every 5 days. ? ?Assessment/Plan:  ?No orders of the defined types were placed in this encounter. ? ? ?There are no discontinued medications.  ? ?No orders of the defined types were placed in this encounter. ?  ? ?1. Type 2 diabetes mellitus with other specified complication, without long-term current use of insulin (Bryan Wang) ?We will consider GLP-1 in the near future once he is back on the plan and eating everything. Bryan Wang will continue metformin and glipizide per his PCP/VA. ? ?2. Hypertension associated with type 2 diabetes mellitus (Bryan Wang) ?Bryan Wang blood pressure is at goal today. He will continue his blood pressure medications per his PCP, decrease salt intake, follow his PNP and lose weight.  ? ?3. Vitamin D deficiency ?I advised the patient to make sure his PCP checks his Vit D level in the  near future along with other labs. Bryan Wang will continue walking 10,000 steps per day or more.  ? ?4. Obesity with current BMI 46.4 ?Bryan Wang is currently in the action stage of change. As such, his goal is to continue with weight loss efforts. He has agreed to the Category 2 Plan.  ? ?Going to her PCP on the 18th, and she will bring in all results of labs to her next office visit.  ? ?Come in fasting for IC repeat to her next office visit. ? ?Exercise goals: Continue with 10,000-13,000 steps per day.  ? ?Behavioral modification strategies: better snacking choices, avoiding temptations, and planning for success. ? ?Bryan Wang has agreed to follow-up with our clinic in 6 weeks. He was informed of the importance of frequent follow-up visits to maximize his success with intensive lifestyle modifications for his multiple health conditions.  ? ?Objective:  ? ?Blood pressure 111/67, pulse 74, temperature 97.8 ?F (36.6 ?C), height '5\' 8"'$  (1.727 m), weight (!) 305 lb (138.3 kg), SpO2 98 %. ?Body mass index is 46.38 kg/m?. ? ?General: Cooperative, alert, well developed, in no acute distress. ?HEENT: Conjunctivae and lids unremarkable. ?Cardiovascular: Regular rhythm.  ?Lungs: Normal work of breathing. ?Neurologic: No focal deficits.  ? ?Lab Results  ?Component Value Date  ? CREATININE 0.85 02/13/2021  ? BUN 17 02/13/2021  ? NA 139 02/13/2021  ? K 4.3 02/13/2021  ? CL 96 02/13/2021  ? CO2 24 02/13/2021  ? ?Lab Results  ?Component Value Date  ?  ALT 25 02/13/2021  ? AST 19 02/13/2021  ? ALKPHOS 65 02/13/2021  ? BILITOT 0.5 02/13/2021  ? ?Lab Results  ?Component Value Date  ? HGBA1C 6.0 (H) 02/13/2021  ? HGBA1C 6.0 (H) 10/12/2020  ? HGBA1C 5.9 (H) 08/01/2020  ? HGBA1C 7.4 05/03/2020  ? HGBA1C 7.4 05/03/2020  ? ?Lab Results  ?Component Value Date  ? INSULIN 50.7 (H) 04/03/2020  ? ?Lab Results  ?Component Value Date  ? TSH 3.530 02/13/2021  ? ?Lab Results  ?Component Value Date  ? CHOL 110 08/01/2020  ? HDL 41 08/01/2020  ? [place] 51  08/01/2020  ? TRIG 92 08/01/2020  ? CHOLHDL 2.7 08/01/2020  ? ?Lab Results  ?Component Value Date  ? VD25OH 23.3 (L) 02/13/2021  ? VD25OH 20.0 (L) 08/01/2020  ? ?Lab Results  ?Component Value Date  ? WBC 10.9 (H) 02/13/2021  ? HGB 14.5 02/13/2021  ? HCT 44.4 02/13/2021  ? MCV 92 02/13/2021  ? PLT 303 02/13/2021  ? ?Lab Results  ?Component Value Date  ? IRON 51 08/01/2020  ? TIBC 253 08/01/2020  ? FERRITIN 147 08/01/2020  ? ? ?Obesity Behavioral Intervention:  ? ?Approximately 15 minutes were spent on the discussion below. ? ?ASK: ?We discussed the diagnosis of obesity with Bryan Wang today and Bryan Wang agreed to give [name] permission to discuss obesity behavioral modification therapy today. ? ?ASSESS: ?Bryan Wang has the diagnosis of obesity and his BMI today is 46.4. Bryan Wang is in the action stage of change.  ? ?ADVISE: ?Bryan Wang was educated on the multiple health risks of obesity as well as the benefit of weight loss to improve his health. He was advised of the need for long term treatment and the importance of lifestyle modifications to improve his current health and to decrease his risk of future health problems. ? ?AGREE: ?Multiple dietary modification options and treatment options were discussed and Bryan Wang agreed to follow the recommendations documented in the above note. ? ?ARRANGE: ?Bryan Wang was educated on the importance of frequent visits to treat obesity as outlined per CMS and USPSTF guidelines and agreed to schedule his next follow up appointment today. ? ?Attestation Statements:  ? ?Reviewed by clinician on day of visit: allergies, medications, problem list, medical history, surgical history, family history, social history, and previous encounter notes. ? ? ?I, Trixie Dredge, am acting as transcriptionist for [employer], DO. ? ?I have reviewed the above documentation for accuracy and completeness, and I agree with the above. Marjory Sneddon, D.O. ? ?The [place] was signed into law in 2016 which  includes the topic of electronic health records.  This provides immediate access to information in MyChart.  This includes consultation notes, operative notes, office notes, lab results and pathology reports.  If you have any questions about what you read please let us know at your next visit so we can discuss your concerns and take corrective action if need be.  We are right here with you. ? ? ?

## 2022-01-02 ENCOUNTER — Ambulatory Visit (INDEPENDENT_AMBULATORY_CARE_PROVIDER_SITE_OTHER): Payer: Medicare HMO | Admitting: Family Medicine

## 2022-01-08 ENCOUNTER — Ambulatory Visit (INDEPENDENT_AMBULATORY_CARE_PROVIDER_SITE_OTHER): Payer: Medicare HMO | Admitting: Family Medicine

## 2022-01-09 ENCOUNTER — Encounter (INDEPENDENT_AMBULATORY_CARE_PROVIDER_SITE_OTHER): Payer: Self-pay | Admitting: *Deleted

## 2022-01-09 ENCOUNTER — Ambulatory Visit (INDEPENDENT_AMBULATORY_CARE_PROVIDER_SITE_OTHER): Payer: Medicare HMO | Admitting: Family Medicine

## 2022-01-09 ENCOUNTER — Encounter (INDEPENDENT_AMBULATORY_CARE_PROVIDER_SITE_OTHER): Payer: Self-pay | Admitting: Family Medicine

## 2022-01-09 VITALS — BP 125/69 | HR 60 | Temp 97.6°F | Ht 68.0 in | Wt 305.0 lb

## 2022-01-09 DIAGNOSIS — Z6841 Body Mass Index (BMI) 40.0 and over, adult: Secondary | ICD-10-CM | POA: Diagnosis not present

## 2022-01-09 DIAGNOSIS — E669 Obesity, unspecified: Secondary | ICD-10-CM

## 2022-01-09 DIAGNOSIS — Z9989 Dependence on other enabling machines and devices: Secondary | ICD-10-CM | POA: Diagnosis not present

## 2022-01-09 DIAGNOSIS — E1169 Type 2 diabetes mellitus with other specified complication: Secondary | ICD-10-CM

## 2022-01-09 DIAGNOSIS — R0602 Shortness of breath: Secondary | ICD-10-CM

## 2022-01-09 DIAGNOSIS — G4733 Obstructive sleep apnea (adult) (pediatric): Secondary | ICD-10-CM | POA: Diagnosis not present

## 2022-01-09 DIAGNOSIS — D508 Other iron deficiency anemias: Secondary | ICD-10-CM | POA: Diagnosis not present

## 2022-01-09 DIAGNOSIS — Z7984 Long term (current) use of oral hypoglycemic drugs: Secondary | ICD-10-CM | POA: Diagnosis not present

## 2022-01-09 MED ORDER — FERROUS SULFATE 325 (65 FE) MG PO TABS: 325.0000 mg | ORAL_TABLET | Freq: Two times a day (BID) | ORAL | Status: AC

## 2022-01-15 ENCOUNTER — Encounter (INDEPENDENT_AMBULATORY_CARE_PROVIDER_SITE_OTHER): Payer: Self-pay | Admitting: Family Medicine

## 2022-01-18 NOTE — Progress Notes (Signed)
Chief Complaint:   OBESITY Mickle is here to discuss his progress with his obesity treatment plan along with follow-up of his obesity related diagnoses. Kahle is on the Category 2 Plan and states he is following his eating plan approximately 30% of the time. Kamdin states he is doing yard work 30 minutes 5 times per week.  Today's visit was #: 22 Starting weight: 299 lbs Starting date: 04/03/2020 Today's weight: 305 lbs Today's date: 01/09/2022 Total lbs lost to date: 0 Total lbs lost since last in-office visit: 0  Interim History: Toan hasn't been eating properly and is skipping meals, at least once or twice a day.  Subjective:   1. Type 2 diabetes mellitus with other specified complication, without long-term current use of insulin (HCC) Rustyn is not checking his blood sugars at home. He is eating whatever he wants and has not been paying attention.  2. Other iron deficiency anemia Pt brought in labs from the [place]. His iron is low but hemoglobin and hematocrit are within normal limits. He takes ferrous sulfate 325 mg daily.  3. OSA on CPAP Ozro reports he has been more tired than usual lately. He discussed this with his doctor and had a sleep study one month ago. Pt has old equipment.  Assessment/Plan:  No orders of the defined types were placed in this encounter.   Medications Discontinued During This Encounter  Medication Reason   ferrous sulfate 325 (65 FE) MG tablet      Meds ordered this encounter  Medications   ferrous sulfate 325 (65 FE) MG tablet    Sig: Take 1 tablet (325 mg total) by mouth 2 (two) times daily with a meal.     1. Type 2 diabetes mellitus with other specified complication, without long-term current use of insulin (HCC) Good blood sugar control is important to decrease the likelihood of diabetic complications such as nephropathy, neuropathy, limb loss, blindness, coronary artery disease, and death. Intensive lifestyle modification including  diet, exercise and weight loss are the first line of treatment for diabetes. Continue Metformin and Glucotrol. Follow prudent nutritional plan, lose weight, and increase activity.  2. Other iron deficiency anemia Increase ferrous sulfate to BID with meals. Continue prudent nutritional plan  Increase & Refill- ferrous sulfate 325 (65 FE) MG tablet; Take 1 tablet (325 mg total) by mouth 2 (two) times daily with a meal.  3. OSA on CPAP Intensive lifestyle modifications are the first line treatment for this issue. We discussed several lifestyle modifications today and he will continue to work on diet, exercise and weight loss efforts. We will continue to monitor. Orders and follow up as documented in patient record. Follow up with PCP/sleep med doctor regarding new equipment and see if his settings need to be adjusted.  4. Obesity with current BMI 46.5 Sovereign is currently in the action stage of change. As such, his goal is to continue with weight loss efforts. He has agreed to change to the Category 3 Plan with 6 oz protein at lunch.   04/03/2020, pt's RMR on IC was 2150 with at weight of 299 lbs. Today, his RMR has increased to 2909 with a weight of 305 lbs.  Exercise goals: All adults should avoid inactivity. Some physical activity is better than none, and adults who participate in any amount of physical activity gain some health benefits.  Behavioral modification strategies: increasing lean protein intake, decreasing simple carbohydrates, and planning for success.  Tom has agreed to follow-up with  our clinic in 3 weeks. He was informed of the importance of frequent follow-up visits to maximize his success with intensive lifestyle modifications for his multiple health conditions.   Objective:   Blood pressure 125/69, pulse 60, temperature 97.6 F (36.4 C), height '5\' 8"'$  (1.727 m), weight (!) 305 lb (138.3 kg), SpO2 99 %. Body mass index is 46.38 kg/m.  General: Cooperative, alert, well  developed, in no acute distress. HEENT: Conjunctivae and lids unremarkable. Cardiovascular: Regular rhythm.  Lungs: Normal work of breathing. Neurologic: No focal deficits.   Lab Results  Component Value Date   CREATININE 0.85 02/13/2021   BUN 17 02/13/2021   NA 139 02/13/2021   K 4.3 02/13/2021   CL 96 02/13/2021   CO2 24 02/13/2021   Lab Results  Component Value Date   ALT 25 02/13/2021   AST 19 02/13/2021   ALKPHOS 65 02/13/2021   BILITOT 0.5 02/13/2021   Lab Results  Component Value Date   HGBA1C 6.7 12/03/2021   HGBA1C 6.0 (H) 02/13/2021   HGBA1C 6.0 (H) 10/12/2020   HGBA1C 5.9 (H) 08/01/2020   HGBA1C 7.4 05/03/2020   HGBA1C 7.4 05/03/2020   Lab Results  Component Value Date   INSULIN 50.7 (H) 04/03/2020   Lab Results  Component Value Date   TSH 2.73 12/03/2021   Lab Results  Component Value Date   CHOL 110 08/01/2020   HDL 41 08/01/2020   LDLCALC 51 08/01/2020   TRIG 92 08/01/2020   CHOLHDL 2.7 08/01/2020   Lab Results  Component Value Date   VD25OH 23.3 (L) 02/13/2021   VD25OH 20.0 (L) 08/01/2020   Lab Results  Component Value Date   WBC 10.9 (H) 02/13/2021   HGB 14.5 02/13/2021   HCT 44.4 02/13/2021   MCV 92 02/13/2021   PLT 303 02/13/2021   Lab Results  Component Value Date   IRON 57 12/03/2021   TIBC 253 08/01/2020   FERRITIN 147 08/01/2020    Obesity Behavioral Intervention:   Approximately 15 minutes were spent on the discussion below.  ASK: We discussed the diagnosis of obesity with [name] today and Reice agreed to give [name] permission to discuss obesity behavioral modification therapy today.  ASSESS: Ramonte has the diagnosis of obesity and his BMI today is 46.5. Jaye is in the action stage of change.   ADVISE: Graviel was educated on the multiple health risks of obesity as well as the benefit of weight loss to improve his health. He was advised of the need for long term treatment and the importance of lifestyle modifications  to improve his current health and to decrease his risk of future health problems.  AGREE: Multiple dietary modification options and treatment options were discussed and Buel agreed to follow the recommendations documented in the above note.  ARRANGE: Ole was educated on the importance of frequent visits to treat obesity as outlined per CMS and USPSTF guidelines and agreed to schedule his next follow up appointment today.  Attestation Statements:   Reviewed by clinician on day of visit: allergies, medications, problem list, medical history, surgical history, family history, social history, and previous encounter notes.  I, Kathlene November, BS, CMA, am acting as transcriptionist for [employer], DO.  I have reviewed the above documentation for accuracy and completeness, and I agree with the above. Marjory Sneddon, D.O.  The [place] was signed into law in 2016 which includes the topic of electronic health records.  This provides immediate access  to information in [place].  This includes consultation notes, operative notes, office notes, lab results and pathology reports.  If you have any questions about what you read please let us know at your next visit so we can discuss your concerns and take corrective action if need be.  We are right here with you.

## 2022-01-30 ENCOUNTER — Ambulatory Visit (INDEPENDENT_AMBULATORY_CARE_PROVIDER_SITE_OTHER): Payer: Medicare HMO | Admitting: Family Medicine

## 2022-03-17 ENCOUNTER — Encounter (INDEPENDENT_AMBULATORY_CARE_PROVIDER_SITE_OTHER): Payer: Self-pay | Admitting: Family Medicine

## 2022-03-17 NOTE — Telephone Encounter (Signed)
LVM for patient to call back and make appointment.

## 2022-03-26 ENCOUNTER — Encounter (INDEPENDENT_AMBULATORY_CARE_PROVIDER_SITE_OTHER): Payer: Self-pay

## 2022-04-16 ENCOUNTER — Encounter (INDEPENDENT_AMBULATORY_CARE_PROVIDER_SITE_OTHER): Payer: Self-pay | Admitting: Family Medicine

## 2022-04-16 ENCOUNTER — Ambulatory Visit (INDEPENDENT_AMBULATORY_CARE_PROVIDER_SITE_OTHER): Payer: Medicare HMO | Admitting: Family Medicine

## 2022-04-16 VITALS — BP 119/68 | HR 66 | Temp 98.1°F | Ht 68.0 in | Wt 301.0 lb

## 2022-04-16 DIAGNOSIS — Z6841 Body Mass Index (BMI) 40.0 and over, adult: Secondary | ICD-10-CM | POA: Diagnosis not present

## 2022-04-16 DIAGNOSIS — E559 Vitamin D deficiency, unspecified: Secondary | ICD-10-CM | POA: Diagnosis not present

## 2022-04-16 DIAGNOSIS — E1169 Type 2 diabetes mellitus with other specified complication: Secondary | ICD-10-CM

## 2022-04-16 DIAGNOSIS — Z7984 Long term (current) use of oral hypoglycemic drugs: Secondary | ICD-10-CM

## 2022-04-16 DIAGNOSIS — E669 Obesity, unspecified: Secondary | ICD-10-CM

## 2022-04-16 MED ORDER — OZEMPIC (0.25 OR 0.5 MG/DOSE) 2 MG/3ML ~~LOC~~ SOPN: PEN_INJECTOR | SUBCUTANEOUS | 0 refills | Status: DC

## 2022-04-25 NOTE — Progress Notes (Unsigned)
Chief Complaint:   OBESITY Bryan Wang is here to discuss his progress with his obesity treatment plan along with follow-up of his obesity related diagnoses. Bryan Wang is on the Category 3 Plan with 6 oz protein at lunch and states he is following his eating plan approximately 0% of the time. Bryan Wang states he is doing yard work 30 minutes 5 times per week.  Today's visit was #: 23 Starting weight: 299 lbs Starting date: 04/03/2020 Today's weight: 301 lbs Today's date: 04/16/2022 Total lbs lost to date: 0 Total lbs lost since last in-office visit: 4  Interim History: Bryan Wang lost 0.2 lbs in fat mass and 4 lbs in muscle mass since his last OV.  Subjective:   1. Type 2 diabetes mellitus with other specified complication, without long-term current use of insulin (HCC) Bryan Wang denies history of reflux, no medullary thyroid cancer, and no history of pancreatitis. Medication: Glipizide, Metformin, Alogliptin  2. Vitamin D deficiency He is currently taking prescription vitamin D 50,000 IU each week. He denies nausea, vomiting or muscle weakness.  Assessment/Plan:   Orders Placed This Encounter  Procedures   Comprehensive metabolic panel   Hemoglobin A1c   Insulin, random   Lipid Panel With LDL/HDL Ratio   VITAMIN D 25 Hydroxy (Vit-D Deficiency, Fractures)   Vitamin B12    Medications Discontinued During This Encounter  Medication Reason   Alogliptin Benzoate 25 MG TABS      Meds ordered this encounter  Medications   Semaglutide,0.25 or 0.'5MG'$ /DOS, (OZEMPIC, 0.25 OR 0.5 MG/DOSE,) 2 MG/3ML SOPN    Sig: 0.'25mg'$  SQ every thursday    Dispense:  3 mL    Refill:  0     1. Type 2 diabetes mellitus with other specified complication, without long-term current use of insulin (HCC) Good blood sugar control is important to decrease the likelihood of diabetic complications such as nephropathy, neuropathy, limb loss, blindness, coronary artery disease, and death. Intensive lifestyle modification  including diet, exercise and weight loss are the first line of treatment for diabetes.  Discontinue Alogliptin. Start Ozempic 0.25 mg, after risks and benefits discussed with pt and long discussion about the medication and how certain foods may cause GI side effects. Continue Metformin. Check fasting labs at next OV.  Start- Semaglutide,0.25 or 0.'5MG'$ /DOS, (OZEMPIC, 0.25 OR 0.5 MG/DOSE,) 2 MG/3ML SOPN; 0.'25mg'$  SQ every Thursday  Dispense: 3 mL; Refill: 0  Check fasting labs at next OV. - Comprehensive metabolic panel - Hemoglobin A1c - Insulin, random - Lipid Panel With LDL/HDL Ratio - Vitamin B12  2. Vitamin D deficiency Low Vitamin D level contributes to fatigue and are associated with obesity, breast, and colon cancer. He agrees to continue to take prescription Vitamin D '@50'$ ,000 IU every week and will follow-up for routine testing of Vitamin D, at least 2-3 times per year to avoid over-replacement.  Check fasting labs at next OV. - VITAMIN D 25 Hydroxy (Vit-D Deficiency, Fractures)  3. Obesity with current BMI 45.8 Bryan Wang is currently in the action stage of change. As such, his goal is to continue with weight loss efforts. He has agreed to the Category 3 Plan with breakfast and lunch options and 6 oz protein at lunch.   Exercise goals: For substantial health benefits, adults should do at least 150 minutes (2 hours and 30 minutes) a week of moderate-intensity, or 75 minutes (1 hour and 15 minutes) a week of vigorous-intensity aerobic physical activity, or an equivalent combination of moderate- and vigorous-intensity aerobic activity. Aerobic  activity should be performed in episodes of at least 10 minutes, and preferably, it should be spread throughout the week.  Behavioral modification strategies: increasing lean protein intake, decreasing simple carbohydrates, and planning for success.  Bryan Wang has agreed to follow-up with our clinic in 3-4 weeks, fasting for blood work. He was informed  of the importance of frequent follow-up visits to maximize his success with intensive lifestyle modifications for his multiple health conditions.   Objective:   Blood pressure 119/68, pulse 66, temperature 98.1 F (36.7 C), height '5\' 8"'$  (1.727 m), weight (!) 301 lb (136.5 kg), SpO2 97 %. Body mass index is 45.77 kg/m.  General: Cooperative, alert, well developed, in no acute distress. HEENT: Conjunctivae and lids unremarkable. Cardiovascular: Regular rhythm.  Lungs: Normal work of breathing. Neurologic: No focal deficits.   Lab Results  Component Value Date   CREATININE 0.85 02/13/2021   BUN 17 02/13/2021   NA 139 02/13/2021   K 4.3 02/13/2021   CL 96 02/13/2021   CO2 24 02/13/2021   Lab Results  Component Value Date   ALT 25 02/13/2021   AST 19 02/13/2021   ALKPHOS 65 02/13/2021   BILITOT 0.5 02/13/2021   Lab Results  Component Value Date   HGBA1C 6.7 12/03/2021   HGBA1C 6.0 (H) 02/13/2021   HGBA1C 6.0 (H) 10/12/2020   HGBA1C 5.9 (H) 08/01/2020   HGBA1C 7.4 05/03/2020   HGBA1C 7.4 05/03/2020   Lab Results  Component Value Date   INSULIN 50.7 (H) 04/03/2020   Lab Results  Component Value Date   TSH 2.73 12/03/2021   Lab Results  Component Value Date   CHOL 110 08/01/2020   HDL 41 08/01/2020   LDLCALC 51 08/01/2020   TRIG 92 08/01/2020   CHOLHDL 2.7 08/01/2020   Lab Results  Component Value Date   VD25OH 23.3 (L) 02/13/2021   VD25OH 20.0 (L) 08/01/2020   Lab Results  Component Value Date   WBC 10.9 (H) 02/13/2021   HGB 14.5 02/13/2021   HCT 44.4 02/13/2021   MCV 92 02/13/2021   PLT 303 02/13/2021   Lab Results  Component Value Date   IRON 57 12/03/2021   TIBC 253 08/01/2020   FERRITIN 147 08/01/2020   Attestation Statements:   Reviewed by clinician on day of visit: allergies, medications, problem list, medical history, surgical history, family history, social history, and previous encounter notes.  Time spent on visit including pre-visit  chart review and post-visit care and charting was 40 minutes.   I, Kathlene November, BS, CMA, am acting as transcriptionist for [employer], DO.   I have reviewed the above documentation for accuracy and completeness, and I agree with the above. Marjory Sneddon, D.O.  The [place] was signed into law in 2016 which includes the topic of electronic health records.  This provides immediate access to information in MyChart.  This includes consultation notes, operative notes, office notes, lab results and pathology reports.  If you have any questions about what you read please let us know at your next visit so we can discuss your concerns and take corrective action if need be.  We are right here with you.

## 2022-05-15 ENCOUNTER — Ambulatory Visit (INDEPENDENT_AMBULATORY_CARE_PROVIDER_SITE_OTHER): Payer: Medicare HMO | Admitting: Family Medicine

## 2022-05-28 ENCOUNTER — Encounter (INDEPENDENT_AMBULATORY_CARE_PROVIDER_SITE_OTHER): Payer: Self-pay | Admitting: Family Medicine

## 2022-05-28 ENCOUNTER — Ambulatory Visit (INDEPENDENT_AMBULATORY_CARE_PROVIDER_SITE_OTHER): Payer: Medicare HMO | Admitting: Family Medicine

## 2022-05-28 VITALS — BP 134/80 | HR 65 | Temp 98.0°F | Ht 68.0 in | Wt 293.0 lb

## 2022-05-28 DIAGNOSIS — E669 Obesity, unspecified: Secondary | ICD-10-CM

## 2022-05-28 DIAGNOSIS — Z6841 Body Mass Index (BMI) 40.0 and over, adult: Secondary | ICD-10-CM | POA: Diagnosis not present

## 2022-05-28 DIAGNOSIS — E1159 Type 2 diabetes mellitus with other circulatory complications: Secondary | ICD-10-CM

## 2022-05-28 DIAGNOSIS — E559 Vitamin D deficiency, unspecified: Secondary | ICD-10-CM | POA: Insufficient documentation

## 2022-05-28 DIAGNOSIS — E611 Iron deficiency: Secondary | ICD-10-CM

## 2022-05-28 DIAGNOSIS — Z7985 Long-term (current) use of injectable non-insulin antidiabetic drugs: Secondary | ICD-10-CM | POA: Diagnosis not present

## 2022-05-28 MED ORDER — OZEMPIC (0.25 OR 0.5 MG/DOSE) 2 MG/3ML ~~LOC~~ SOPN: PEN_INJECTOR | SUBCUTANEOUS | 0 refills | Status: DC

## 2022-06-07 NOTE — Progress Notes (Unsigned)
Chief Complaint:   OBESITY Bryan Wang is here to discuss his progress with his obesity treatment plan along with follow-up of his obesity related diagnoses. Bryan Wang is on the Category 3 Plan with breakfast and lunch options and 6 oz protein at lunch and states he is following his eating plan approximately 30% of the time. Bryan Wang states he is hunting 45 minutes 4 times per week.  Today's visit was #: 24 Starting weight: 299 lbs Starting date: 04/03/2020 Today's weight: 293 lbs Today's date: 05/28/2022 Total lbs lost to date: 6 Total lbs lost since last in-office visit: 8  Interim History: Haroon is walking more while deer hunting. Increased activity is not what he thinks caused his weight loss. Pt's last OV was 04/16/2022. Pt declines blood work here because of cost. He will obtain labs at [facility] for free and bring hardcopy of results to next OV.  Subjective:   1. Type 2 diabetes mellitus with other circulatory complication, without long-term current use of insulin ([place]) Zarion started Ozempic at last OV and is tolerating it well. His appetite has decreased and his highest fasting blood sugar 130 and lowest 80. He is asymptomatic and without issues.  2. Iron deficiency Quang takes 325 mg of ferrous sulfate twice a day and denies side effects or issues.  Assessment/Plan:  No orders of the defined types were placed in this encounter.   Medications Discontinued During This Encounter  Medication Reason   Semaglutide,0.25 or 0.'5MG'$ /DOS, (OZEMPIC, 0.25 OR 0.5 MG/DOSE,) 2 MG/3ML SOPN Reorder     Meds ordered this encounter  Medications   Semaglutide,0.25 or 0.'5MG'$ /DOS, (OZEMPIC, 0.25 OR 0.5 MG/DOSE,) 2 MG/3ML SOPN    Sig: 0.'5mg'$  SQ every thursday    Dispense:  3 mL    Refill:  0     1. Type 2 diabetes mellitus with other circulatory complication, without long-term current use of insulin (HCC) Good blood sugar control is important to decrease the likelihood of diabetic complications such  as nephropathy, neuropathy, limb loss, blindness, coronary artery disease, and death. Intensive lifestyle modification including diet, exercise and weight loss are the first line of treatment for diabetes.  Continue Metformin and prudent nutritional plan and increase exercise.  Increase & Refill- Semaglutide,0.25 or 0.'5MG'$ /DOS, (OZEMPIC, 0.25 OR 0.5 MG/DOSE,) 2 MG/3ML SOPN; 0.'5mg'$  SQ every Thursday  Dispense: 3 mL; Refill: 0  2. Iron deficiency Orders and follow up as documented in patient record.  Counseling Iron is essential for our bodies to make red blood cells.  Reasons that someone may be deficient include: an iron-deficient diet (more likely in those following vegan or vegetarian diets), women with heavy menses, patients with GI disorders or poor absorption, patients that have had bariatric surgery, frequent blood donors, patients with cancer, and patients with heart disease.   Iron-rich foods include dark leafy greens, red and white meats, eggs, seafood, and beans.   Certain foods and drinks prevent your body from absorbing iron properly. Avoid eating these foods in the same meal as iron-rich foods or with iron supplements. These foods include: coffee, black tea, and red wine; milk, dairy products, and foods that are high in calcium; beans and soybeans; whole grains.  Constipation can be a side effect of iron supplementation. Increased water and fiber intake are helpful. Water goal: > 2 liters/day. Fiber goal: > 25 grams/day.  3. Obesity with current BMI 41 Jayko is currently in the action stage of change. As such, his goal is to continue with weight loss  efforts. He has agreed to the Category 3 Plan with breakfast and lunch options and 6 oz protein at lunch.   Exercise goals:  As is  Behavioral modification strategies: no skipping meals and planning for success.  Jazziel has agreed to follow-up with our clinic in 3-4 weeks. He was informed of the importance of frequent follow-up visits to  maximize his success with intensive lifestyle modifications for his multiple health conditions.   Objective:   Blood pressure 134/80, pulse 65, temperature 98 F (36.7 C), height '5\' 8"'$  (1.727 m), weight 293 lb (132.9 kg), SpO2 97 %. Body mass index is 44.55 kg/m.  General: Cooperative, alert, well developed, in no acute distress. HEENT: Conjunctivae and lids unremarkable. Cardiovascular: Regular rhythm.  Lungs: Normal work of breathing. Neurologic: No focal deficits.   Lab Results  Component Value Date   CREATININE 0.85 02/13/2021   BUN 17 02/13/2021   NA 139 02/13/2021   K 4.3 02/13/2021   CL 96 02/13/2021   CO2 24 02/13/2021   Lab Results  Component Value Date   ALT 25 02/13/2021   AST 19 02/13/2021   ALKPHOS 65 02/13/2021   BILITOT 0.5 02/13/2021   Lab Results  Component Value Date   HGBA1C 6.7 12/03/2021   HGBA1C 6.0 (H) 02/13/2021   HGBA1C 6.0 (H) 10/12/2020   HGBA1C 5.9 (H) 08/01/2020   HGBA1C 7.4 05/03/2020   HGBA1C 7.4 05/03/2020   Lab Results  Component Value Date   INSULIN 50.7 (H) 04/03/2020   Lab Results  Component Value Date   TSH 2.73 12/03/2021   Lab Results  Component Value Date   CHOL 110 08/01/2020   HDL 41 08/01/2020   LDLCALC 51 08/01/2020   TRIG 92 08/01/2020   CHOLHDL 2.7 08/01/2020   Lab Results  Component Value Date   VD25OH 23.3 (L) 02/13/2021   VD25OH 20.0 (L) 08/01/2020   Lab Results  Component Value Date   WBC 10.9 (H) 02/13/2021   HGB 14.5 02/13/2021   HCT 44.4 02/13/2021   MCV 92 02/13/2021   PLT 303 02/13/2021   Lab Results  Component Value Date   IRON 57 12/03/2021   TIBC 253 08/01/2020   FERRITIN 147 08/01/2020    Obesity Behavioral Intervention:   Approximately 15 minutes were spent on the discussion below.  ASK: We discussed the diagnosis of obesity with [name] today and Corgan agreed to give [name] permission to discuss obesity behavioral modification therapy today.  ASSESS: Dudley has the diagnosis of  obesity and his BMI today is 44.6. Kam is in the action stage of change.   ADVISE: Wirt was educated on the multiple health risks of obesity as well as the benefit of weight loss to improve his health. He was advised of the need for long term treatment and the importance of lifestyle modifications to improve his current health and to decrease his risk of future health problems.  AGREE: Multiple dietary modification options and treatment options were discussed and [name] agreed to follow the recommendations documented in the above note.  ARRANGE: Pearley was educated on the importance of frequent visits to treat obesity as outlined per CMS and USPSTF guidelines and agreed to schedule his next follow up appointment today.  Attestation Statements:   Reviewed by clinician on day of visit: allergies, medications, problem list, medical history, surgical history, family history, social history, and previous encounter notes.  I, Kathlene November, BS, CMA, am acting as transcriptionist for [employer], DO.   I have  reviewed the above documentation for accuracy and completeness, and I agree with the above. Marjory Sneddon, D.O.  The [place] was signed into law in 2016 which includes the topic of electronic health records.  This provides immediate access to information in MyChart.  This includes consultation notes, operative notes, office notes, lab results and pathology reports.  If you have any questions about what you read please let us know at your next visit so we can discuss your concerns and take corrective action if need be.  We are right here with you.

## 2022-06-25 ENCOUNTER — Encounter (INDEPENDENT_AMBULATORY_CARE_PROVIDER_SITE_OTHER): Payer: Self-pay | Admitting: Family Medicine

## 2022-06-25 ENCOUNTER — Ambulatory Visit (INDEPENDENT_AMBULATORY_CARE_PROVIDER_SITE_OTHER): Payer: Medicare HMO | Admitting: Family Medicine

## 2022-06-25 VITALS — BP 123/78 | HR 78 | Temp 97.9°F | Ht 68.0 in | Wt 293.4 lb

## 2022-06-25 DIAGNOSIS — E1159 Type 2 diabetes mellitus with other circulatory complications: Secondary | ICD-10-CM | POA: Diagnosis not present

## 2022-06-25 DIAGNOSIS — E669 Obesity, unspecified: Secondary | ICD-10-CM | POA: Diagnosis not present

## 2022-06-25 DIAGNOSIS — Z6841 Body Mass Index (BMI) 40.0 and over, adult: Secondary | ICD-10-CM

## 2022-06-25 DIAGNOSIS — E559 Vitamin D deficiency, unspecified: Secondary | ICD-10-CM

## 2022-06-25 DIAGNOSIS — Z7985 Long-term (current) use of injectable non-insulin antidiabetic drugs: Secondary | ICD-10-CM

## 2022-06-25 MED ORDER — OZEMPIC (0.25 OR 0.5 MG/DOSE) 2 MG/3ML ~~LOC~~ SOPN: PEN_INJECTOR | SUBCUTANEOUS | 0 refills | Status: DC

## 2022-06-25 MED ORDER — VITAMIN D (ERGOCALCIFEROL) 1.25 MG (50000 UNIT) PO CAPS: ORAL_CAPSULE | ORAL | 0 refills | Status: AC

## 2022-07-08 NOTE — Progress Notes (Signed)
Chief Complaint:   OBESITY Bryan Wang is here to discuss his progress with his obesity treatment plan along with follow-up of his obesity related diagnoses. Bryan Wang is on the Category 3 Plan and states he is following his eating plan approximately 20% of the time. Bryan Wang states he is deer hunting 4 times per week.  Today's visit was #: 25 Starting weight: 299 lbs Starting date: 04/03/2020 Today's weight: 293 lbs Today's date: 06/25/2022 Total lbs lost to date: 6 lbs Total lbs lost since last in-office visit: 0  Interim History: Recently sick 5-6 days and did not eat well but did not eat poorly.  Has more recently been doing better and trying to focus on increasing proteins.   Subjective:   1. Type 2 diabetes mellitus with other circulatory complication, without long-term current use of insulin ([place]) Last office visit we increased dose and tolerated well, no side effects. This has helped with his cravings, has been eating less cookies.  No low or high blood sugars.    2. Vitamin D deficiency He is currently taking prescription vitamin D 50,000 IU each week. He denies nausea, vomiting or muscle weakness.  Last Vitamin D level 02/13/2021 was 23.3.  Assessment/Plan:  No orders of the defined types were placed in this encounter.   Medications Discontinued During This Encounter  Medication Reason   Semaglutide,0.25 or 0.'5MG'$ /DOS, (OZEMPIC, 0.25 OR 0.5 MG/DOSE,) 2 MG/3ML SOPN Reorder   Vitamin D, Ergocalciferol, (DRISDOL) 1.25 MG (50000 UNIT) CAPS capsule Reorder     Meds ordered this encounter  Medications   Semaglutide,0.25 or 0.'5MG'$ /DOS, (OZEMPIC, 0.25 OR 0.5 MG/DOSE,) 2 MG/3ML SOPN    Sig: 0.'5mg'$  SQ every thursday    Dispense:  3 mL    Refill:  0   Vitamin D, Ergocalciferol, (DRISDOL) 1.25 MG (50000 UNIT) CAPS capsule    Sig: 1 po q 5 days    Dispense:  6 capsule    Refill:  0    30 d supply;  ** OV for RF **   Do not send RF request     1. Type 2 diabetes mellitus with  other circulatory complication, without long-term current use of insulin (HCC) Continue other medications [facility]CP and [facility].  Cautioned about hypoglycemia due to glucotrol and management strategies if it occurs.   Refill - Semaglutide,0.25 or 0.'5MG'$ /DOS, (OZEMPIC, 0.25 OR 0.5 MG/DOSE,) 2 MG/3ML SOPN; 0.'5mg'$  SQ every thursday  Dispense: 3 mL; Refill: 0  2. Vitamin D deficiency Low Vitamin D level contributes to fatigue and are associated with obesity, breast, and colon cancer. He agrees to continue to take prescription Vitamin D '@50'$ ,000 IU every week and will follow-up for routine testing of Vitamin D, at least 2-3 times per year to avoid over-replacement.  Refill - Vitamin D, Ergocalciferol, (DRISDOL) 1.25 MG (50000 UNIT) CAPS capsule; 1 po q 5 days  Dispense: 6 capsule; Refill: 0  3. Obesity with current BMI 44.6 Patient never came in for blood work.  He will get at the [place] fro free with his PCP in the near future.   Leone is currently in the action stage of change. As such, his goal is to continue with weight loss efforts. He has agreed to the Category 3 Plan.+ 6 oz protein at lunch.    Exercise goals:  As is.   Behavioral modification strategies: holiday eating strategies  and planning for success.  Zeth has agreed to follow-up with our clinic in 4 weeks. He was informed of  the importance of frequent follow-up visits to maximize his success with intensive lifestyle modifications for his multiple health conditions.   Objective:   Blood pressure 123/78, pulse 78, temperature 97.9 F (36.6 C), height '5\' 8"'$  (1.727 m), weight 293 lb 6.4 oz (133.1 kg), SpO2 96 %. Body mass index is 44.61 kg/m.  General: Cooperative, alert, well developed, in no acute distress. HEENT: Conjunctivae and lids unremarkable. Cardiovascular: Regular rhythm.  Lungs: Normal work of breathing. Neurologic: No focal deficits.   Lab Results  Component Value Date   CREATININE 0.85 02/13/2021   BUN 17 02/13/2021    NA 139 02/13/2021   K 4.3 02/13/2021   CL 96 02/13/2021   CO2 24 02/13/2021   Lab Results  Component Value Date   ALT 25 02/13/2021   AST 19 02/13/2021   ALKPHOS 65 02/13/2021   BILITOT 0.5 02/13/2021   Lab Results  Component Value Date   HGBA1C 6.7 12/03/2021   HGBA1C 6.0 (H) 02/13/2021   HGBA1C 6.0 (H) 10/12/2020   HGBA1C 5.9 (H) 08/01/2020   HGBA1C 7.4 05/03/2020   HGBA1C 7.4 05/03/2020   Lab Results  Component Value Date   INSULIN 50.7 (H) 04/03/2020   Lab Results  Component Value Date   TSH 2.73 12/03/2021   Lab Results  Component Value Date   CHOL 110 08/01/2020   HDL 41 08/01/2020   LDLCALC 51 08/01/2020   TRIG 92 08/01/2020   CHOLHDL 2.7 08/01/2020   Lab Results  Component Value Date   VD25OH 23.3 (L) 02/13/2021   VD25OH 20.0 (L) 08/01/2020   Lab Results  Component Value Date   WBC 10.9 (H) 02/13/2021   HGB 14.5 02/13/2021   HCT 44.4 02/13/2021   MCV 92 02/13/2021   PLT 303 02/13/2021   Lab Results  Component Value Date   IRON 57 12/03/2021   TIBC 253 08/01/2020   FERRITIN 147 08/01/2020    Attestation Statements:   Reviewed by clinician on day of visit: allergies, medications, problem list, medical history, surgical history, family history, social history, and previous encounter notes.  I, Davy Pique, RMA, am acting as Location manager for [employer], DO.   I have reviewed the above documentation for accuracy and completeness, and I agree with the above. Marjory Sneddon, D.O.  The [place] was signed into law in 2016 which includes the topic of electronic health records.  This provides immediate access to information in MyChart.  This includes consultation notes, operative notes, office notes, lab results and pathology reports.  If you have any questions about what you read please let us know at your next visit so we can discuss your concerns and take corrective action if need be.  We are right here with you.

## 2022-07-30 ENCOUNTER — Ambulatory Visit (INDEPENDENT_AMBULATORY_CARE_PROVIDER_SITE_OTHER): Payer: Medicare HMO | Admitting: Family Medicine

## 2022-09-12 IMAGING — DX DG KNEE 1-2V PORT*L*
2 series · 2 of 2 positions shown · non-contrast
Comparison: Plain films left knee 07/26/2020.

CLINICAL DATA: Status post left knee replacement today.

EXAM:
PORTABLE LEFT KNEE - 1-2 VIEW

[knee ap]
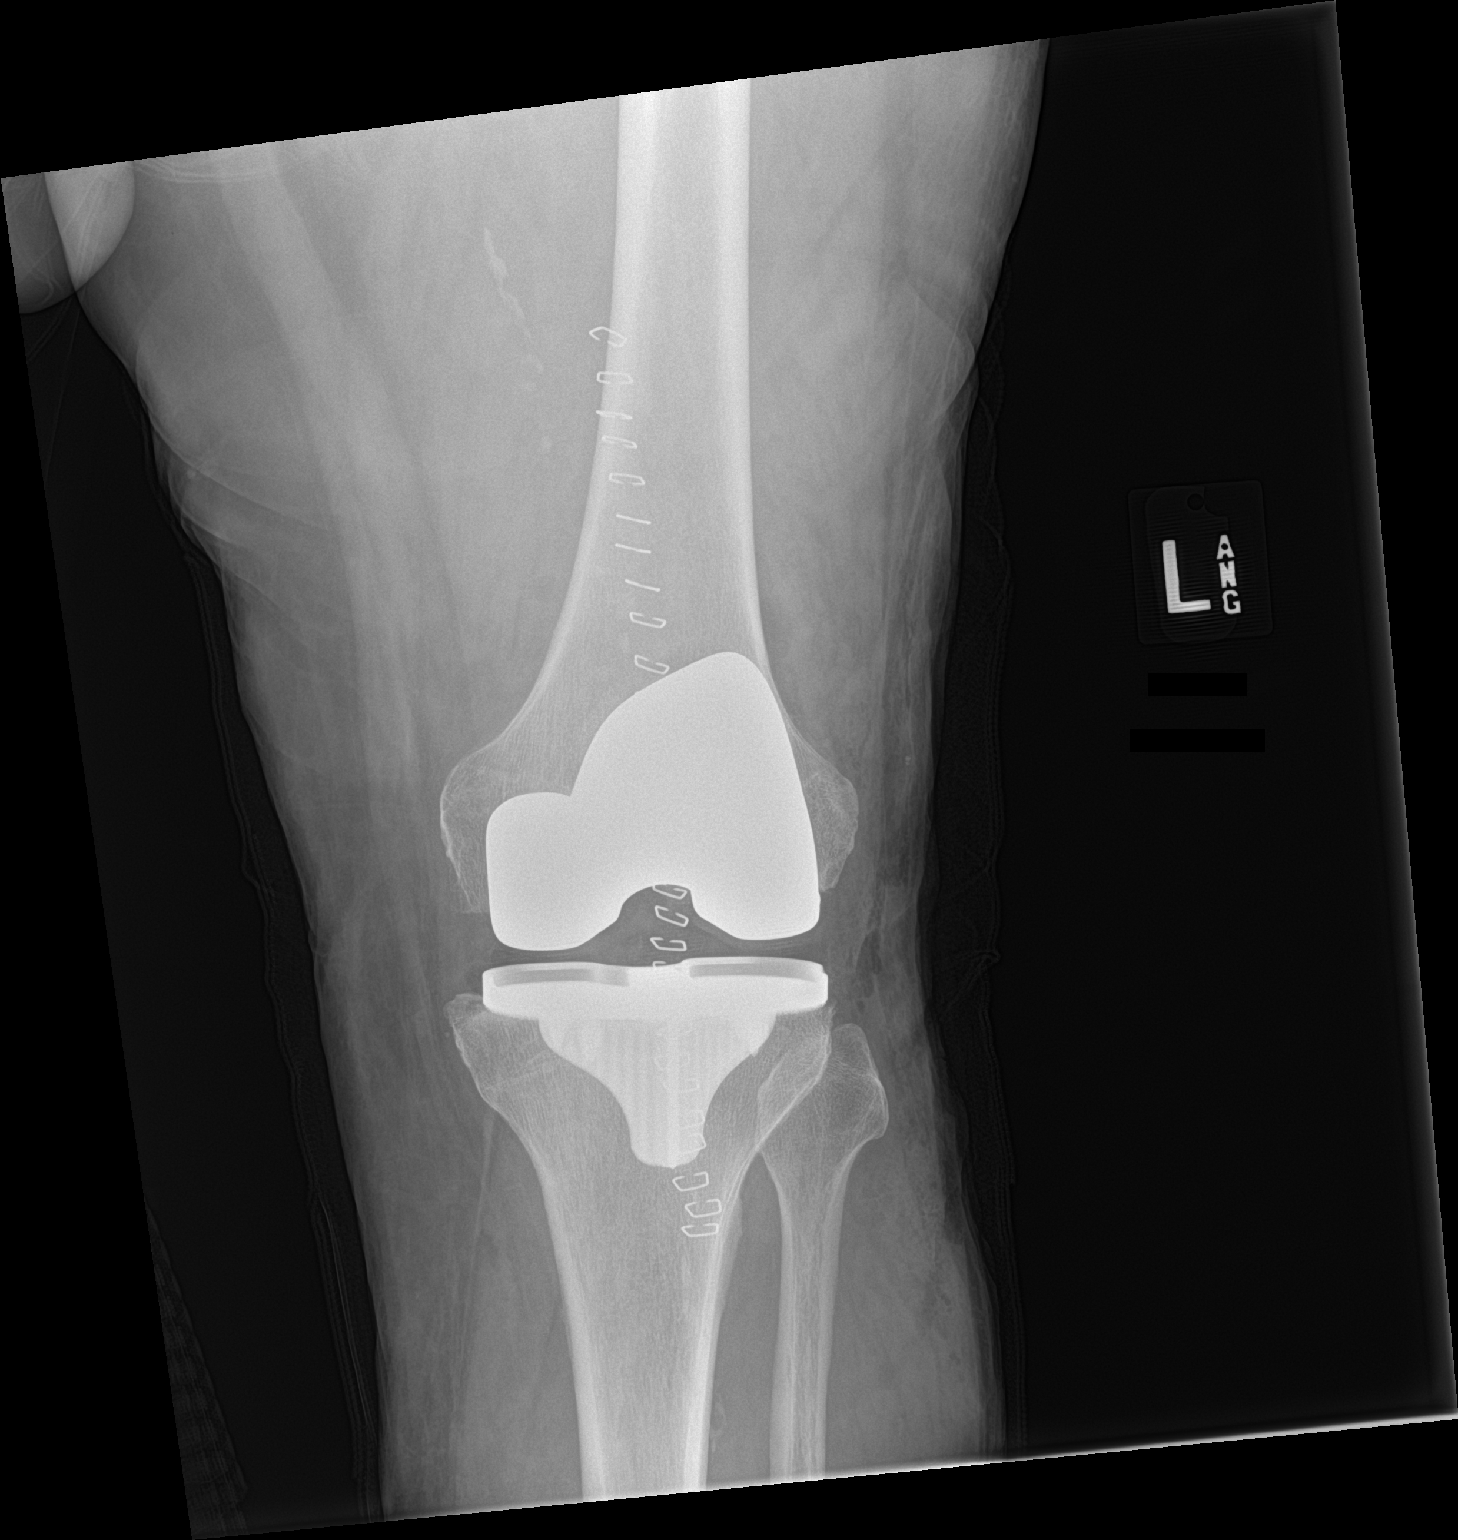

[knee lat]
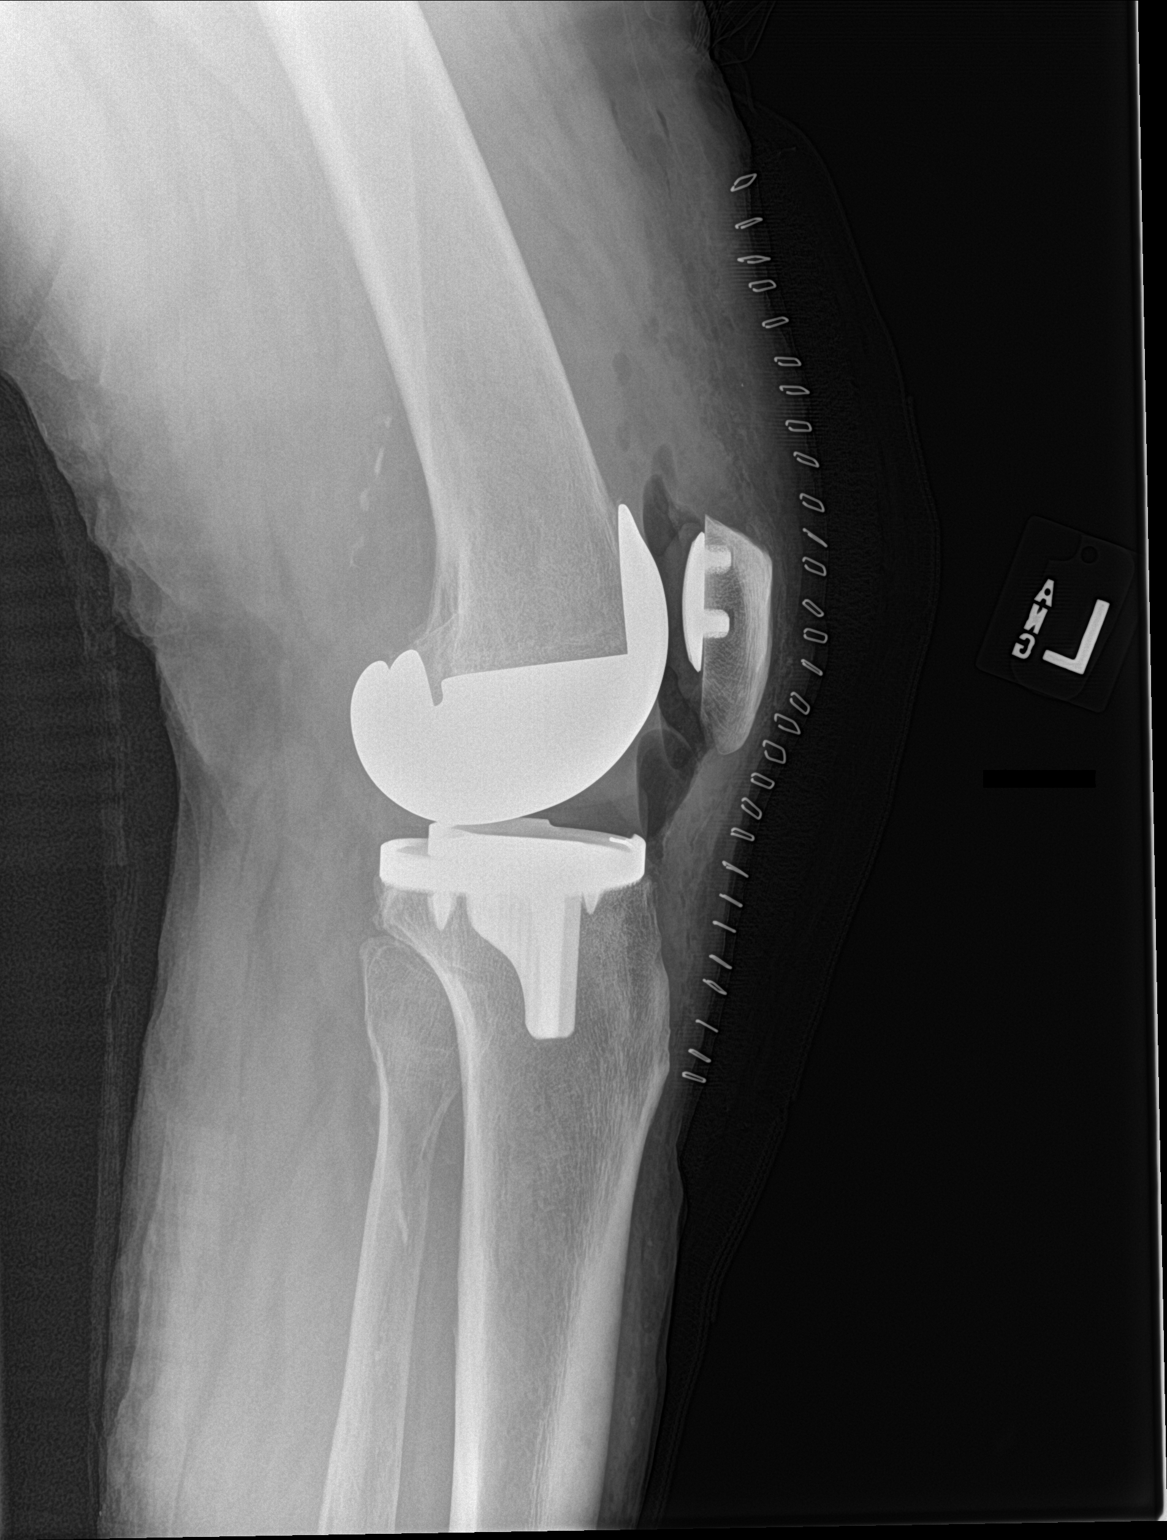

[2 of 2 positions shown; findings below may reference images not displayed]

FINDINGS: New left knee arthroplasty is in place. Hardware is intact. No acute
abnormality. Gas in the soft tissues and surgical staples noted.
IMPRESSION: Status post left knee replacement.  No acute abnormality.

## 2022-11-26 DIAGNOSIS — C44229 Squamous cell carcinoma of skin of left ear and external auricular canal: Secondary | ICD-10-CM | POA: Diagnosis not present

## 2023-01-08 ENCOUNTER — Telehealth: Payer: Self-pay | Admitting: Internal Medicine

## 2023-01-08 NOTE — Telephone Encounter (Signed)
Reviewed available records from Dr. Kinnie Scales. No formal colonoscopy report was available for review. 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  Based upon these results, patient would be an appropriate candidate for colonoscopy for history of tubular adenomas. Okay to schedule for direct colonoscopy.

## 2023-01-08 NOTE — Telephone Encounter (Signed)
Hi Dr. Leonides Schanz,    Supervising Provider PM 01/08/23  We received a referral for this patient for a Colonoscopy. He has previous history with Dr. Kinnie Scales. Due to him retiring he currently does not have a Gastroenterology provider and would like to transfer his care over to [name]. His records are in [facility] for you to review and advise on scheduling.   Thank you.

## 2023-01-09 ENCOUNTER — Encounter: Payer: Self-pay | Admitting: Internal Medicine

## 2023-02-11 ENCOUNTER — Ambulatory Visit (AMBULATORY_SURGERY_CENTER): Payer: No Typology Code available for payment source

## 2023-02-11 VITALS — Ht 68.0 in | Wt 260.0 lb

## 2023-02-11 DIAGNOSIS — Z86010 Personal history of colonic polyps: Secondary | ICD-10-CM

## 2023-02-11 MED ORDER — PEG 3350-KCL-NA BICARB-NACL 420 G PO SOLR: 4000.0000 mL | Freq: Once | ORAL | 0 refills | Status: AC

## 2023-02-11 NOTE — Progress Notes (Signed)
No egg or soy allergy known to patient  No issues known to pt with past sedation with any surgeries or procedures Patient denies ever being told they had issues or difficulty with intubation  No FH of Malignant Hyperthermia Pt is not on diet pills Pt is not on  home 02  Pt is not on blood thinners  Pt denies issues with constipation  No A fib or A flutter Have any cardiac testing pending--no Pt instructed to use Singlecare.com or GoodRx for a price reduction on prep  Patient's chart reviewed by Bryan Wang CNRA prior to previsit and patient appropriate for the [facility].  Previsit completed and red dot placed by patient's name on their procedure day (on provider's schedule).   Can ambulate independently 

## 2023-02-18 DIAGNOSIS — E119 Type 2 diabetes mellitus without complications: Secondary | ICD-10-CM | POA: Diagnosis not present

## 2023-02-18 DIAGNOSIS — Z7984 Long term (current) use of oral hypoglycemic drugs: Secondary | ICD-10-CM | POA: Diagnosis not present

## 2023-02-18 DIAGNOSIS — R531 Weakness: Secondary | ICD-10-CM | POA: Diagnosis not present

## 2023-02-18 DIAGNOSIS — Z87891 Personal history of nicotine dependence: Secondary | ICD-10-CM | POA: Diagnosis not present

## 2023-02-18 DIAGNOSIS — M791 Myalgia, unspecified site: Secondary | ICD-10-CM | POA: Diagnosis not present

## 2023-02-18 DIAGNOSIS — M79631 Pain in right forearm: Secondary | ICD-10-CM | POA: Diagnosis not present

## 2023-02-25 DIAGNOSIS — R918 Other nonspecific abnormal finding of lung field: Secondary | ICD-10-CM | POA: Diagnosis not present

## 2023-02-25 DIAGNOSIS — Z79899 Other long term (current) drug therapy: Secondary | ICD-10-CM | POA: Diagnosis not present

## 2023-02-25 DIAGNOSIS — I440 Atrioventricular block, first degree: Secondary | ICD-10-CM | POA: Diagnosis not present

## 2023-02-25 DIAGNOSIS — R9431 Abnormal electrocardiogram [ECG] [EKG]: Secondary | ICD-10-CM | POA: Diagnosis not present

## 2023-02-25 DIAGNOSIS — I452 Bifascicular block: Secondary | ICD-10-CM | POA: Diagnosis not present

## 2023-02-25 DIAGNOSIS — R2242 Localized swelling, mass and lump, left lower limb: Secondary | ICD-10-CM | POA: Diagnosis not present

## 2023-02-25 DIAGNOSIS — R0989 Other specified symptoms and signs involving the circulatory and respiratory systems: Secondary | ICD-10-CM | POA: Diagnosis not present

## 2023-02-25 DIAGNOSIS — R110 Nausea: Secondary | ICD-10-CM | POA: Diagnosis not present

## 2023-02-25 DIAGNOSIS — I4510 Unspecified right bundle-branch block: Secondary | ICD-10-CM | POA: Diagnosis not present

## 2023-02-25 DIAGNOSIS — Z7984 Long term (current) use of oral hypoglycemic drugs: Secondary | ICD-10-CM | POA: Diagnosis not present

## 2023-02-25 DIAGNOSIS — R079 Chest pain, unspecified: Secondary | ICD-10-CM | POA: Diagnosis not present

## 2023-02-25 DIAGNOSIS — L03116 Cellulitis of left lower limb: Secondary | ICD-10-CM | POA: Diagnosis not present

## 2023-02-25 DIAGNOSIS — I2699 Other pulmonary embolism without acute cor pulmonale: Secondary | ICD-10-CM | POA: Diagnosis not present

## 2023-02-25 DIAGNOSIS — I2510 Atherosclerotic heart disease of native coronary artery without angina pectoris: Secondary | ICD-10-CM | POA: Diagnosis not present

## 2023-02-25 DIAGNOSIS — L0390 Cellulitis, unspecified: Secondary | ICD-10-CM | POA: Diagnosis not present

## 2023-02-25 DIAGNOSIS — R5383 Other fatigue: Secondary | ICD-10-CM | POA: Diagnosis not present

## 2023-02-25 DIAGNOSIS — I2693 Single subsegmental pulmonary embolism without acute cor pulmonale: Secondary | ICD-10-CM | POA: Diagnosis not present

## 2023-03-02 ENCOUNTER — Encounter: Payer: Self-pay | Admitting: Internal Medicine

## 2023-03-04 ENCOUNTER — Telehealth: Payer: Self-pay | Admitting: Internal Medicine

## 2023-03-04 ENCOUNTER — Telehealth: Payer: Self-pay | Admitting: *Deleted

## 2023-03-04 NOTE — Telephone Encounter (Signed)
Pt instructed that since he has been put on a blood thinner due to recent clot in the lung he should cancel his colonoscopy and reschedule when the MD who is managing his blood thinner says it is is safe to do one. Pt placed on blood thinner after leg infection "threw a clot" to his lungs. Pt states he won't "be on it long" Procedure canceled and intructed pt to reschedule once he as clearance from MD.

## 2023-03-04 NOTE — Telephone Encounter (Signed)
PT is scheduled for a colonoscopy on 7/25 and  was recently hospitalized for a small blood clot in  his lungs and he was put on Eliquis. He wants to know if the procedure would need to be postponed. Please advise.

## 2023-03-12 ENCOUNTER — Encounter: Payer: Medicare HMO | Admitting: Internal Medicine

## 2023-03-17 DIAGNOSIS — C44319 Basal cell carcinoma of skin of other parts of face: Secondary | ICD-10-CM | POA: Diagnosis not present

## 2023-09-17 DIAGNOSIS — E1165 Type 2 diabetes mellitus with hyperglycemia: Secondary | ICD-10-CM | POA: Diagnosis not present

## 2023-09-17 DIAGNOSIS — H469 Unspecified optic neuritis: Secondary | ICD-10-CM | POA: Diagnosis not present

## 2023-09-17 DIAGNOSIS — R531 Weakness: Secondary | ICD-10-CM | POA: Diagnosis not present

## 2023-09-17 DIAGNOSIS — H5711 Ocular pain, right eye: Secondary | ICD-10-CM | POA: Diagnosis not present

## 2023-09-17 DIAGNOSIS — H47011 Ischemic optic neuropathy, right eye: Secondary | ICD-10-CM | POA: Diagnosis not present

## 2023-09-17 DIAGNOSIS — Z7984 Long term (current) use of oral hypoglycemic drugs: Secondary | ICD-10-CM | POA: Diagnosis not present

## 2023-09-17 DIAGNOSIS — G529 Cranial nerve disorder, unspecified: Secondary | ICD-10-CM | POA: Diagnosis not present

## 2023-10-23 DIAGNOSIS — J101 Influenza due to other identified influenza virus with other respiratory manifestations: Secondary | ICD-10-CM | POA: Diagnosis not present

## 2023-10-23 DIAGNOSIS — R509 Fever, unspecified: Secondary | ICD-10-CM | POA: Diagnosis not present

## 2023-10-23 DIAGNOSIS — R051 Acute cough: Secondary | ICD-10-CM | POA: Diagnosis not present

## 2024-06-02 NOTE — Progress Notes (Signed)
 Bryan Wang                                          MRN: [id number]   06/02/2024   The [facility] Quality Team Specialist reviewed this patient medical record for the purposes of chart review for care gap closure. The following were reviewed: chart review for care gap closure-diabetic eye exam and kidney health evaluation for diabetes:eGFR  and uACR.    [facility] Quality Team

## 2024-09-19 ENCOUNTER — Encounter: Payer: Self-pay | Admitting: *Deleted
